# Patient Record
Sex: Male | Born: 1975 | Hispanic: Yes | Marital: Married | Smoking: Current every day smoker
Health system: Southern US, Community
[De-identification: ages and names within clinical notes are randomized; demographics above are authoritative.]

## PROBLEM LIST (undated history)

## (undated) DIAGNOSIS — E785 Hyperlipidemia, unspecified: Secondary | ICD-10-CM

## (undated) DIAGNOSIS — K429 Umbilical hernia without obstruction or gangrene: Secondary | ICD-10-CM

## (undated) HISTORY — DX: Umbilical hernia without obstruction or gangrene: K42.9

## (undated) HISTORY — DX: Hyperlipidemia, unspecified: E78.5

---

## 2010-12-28 ENCOUNTER — Other Ambulatory Visit: Payer: Self-pay | Admitting: Occupational Medicine

## 2010-12-28 ENCOUNTER — Ambulatory Visit: Payer: Self-pay

## 2010-12-28 DIAGNOSIS — M549 Dorsalgia, unspecified: Secondary | ICD-10-CM

## 2014-04-26 ENCOUNTER — Emergency Department (HOSPITAL_COMMUNITY)
Admission: EM | Admit: 2014-04-26 | Discharge: 2014-04-26 | Disposition: A | Discharge status: Home / Self Care | Payer: Self-pay | Attending: Emergency Medicine | Admitting: Emergency Medicine

## 2014-04-26 ENCOUNTER — Emergency Department (HOSPITAL_COMMUNITY): Payer: Self-pay

## 2014-04-26 ENCOUNTER — Encounter (HOSPITAL_COMMUNITY): Payer: Self-pay | Admitting: *Deleted

## 2014-04-26 DIAGNOSIS — J45901 Unspecified asthma with (acute) exacerbation: Secondary | ICD-10-CM | POA: Insufficient documentation

## 2014-04-26 DIAGNOSIS — Z720 Tobacco use: Secondary | ICD-10-CM | POA: Insufficient documentation

## 2014-04-26 LAB — CBC WITH DIFFERENTIAL/PLATELET
Basophils Absolute: 0.1 K/uL (ref 0.0–0.1)
Basophils Relative: 1 % (ref 0–1)
Eosinophils Absolute: 1.3 K/uL — ABNORMAL HIGH (ref 0.0–0.7)
Eosinophils Relative: 12 % — ABNORMAL HIGH (ref 0–5)
HCT: 43.3 % (ref 39.0–52.0)
Hemoglobin: 14.7 g/dL (ref 13.0–17.0)
Lymphocytes Relative: 21 % (ref 12–46)
Lymphs Abs: 2.2 K/uL (ref 0.7–4.0)
MCH: 32 pg (ref 26.0–34.0)
MCHC: 33.9 g/dL (ref 30.0–36.0)
MCV: 94.1 fL (ref 78.0–100.0)
Monocytes Absolute: 0.8 K/uL (ref 0.1–1.0)
Monocytes Relative: 7 % (ref 3–12)
Neutro Abs: 6.3 K/uL (ref 1.7–7.7)
Neutrophils Relative %: 59 % (ref 43–77)
Platelets: 230 K/uL (ref 150–400)
RBC: 4.6 MIL/uL (ref 4.22–5.81)
RDW: 12.3 % (ref 11.5–15.5)
WBC: 10.7 K/uL — ABNORMAL HIGH (ref 4.0–10.5)

## 2014-04-26 LAB — BASIC METABOLIC PANEL WITH GFR
Anion gap: 9 (ref 5–15)
BUN: 12 mg/dL (ref 6–23)
CO2: 22 mmol/L (ref 19–32)
Calcium: 9.2 mg/dL (ref 8.4–10.5)
Chloride: 107 mmol/L (ref 96–112)
Creatinine, Ser: 0.63 mg/dL (ref 0.50–1.35)
GFR calc Af Amer: >90 mL/min (ref >90)
GFR calc non Af Amer: >90 mL/min (ref >90)
Glucose, Bld: 121 mg/dL — ABNORMAL HIGH (ref 70–99)
Potassium: 4 mmol/L (ref 3.5–5.1)
Sodium: 138 mmol/L (ref 135–145)

## 2014-04-26 LAB — I-STAT TROPONIN, ED: Troponin i, poc: 0 ng/mL (ref 0.00–0.08)

## 2014-04-26 MED ORDER — IPRATROPIUM-ALBUTEROL 0.5-2.5 (3) MG/3ML IN SOLN
3.0000 mL | Freq: Once | RESPIRATORY_TRACT | Status: AC
  Administered 2014-04-26: 3 mL via RESPIRATORY_TRACT
  Filled 2014-04-26: qty 3

## 2014-04-26 MED ORDER — PREDNISONE 20 MG PO TABS
40.0000 mg | ORAL_TABLET | Freq: Once | ORAL | Status: AC
  Administered 2014-04-26: 40 mg via ORAL
  Filled 2014-04-26: qty 2

## 2014-04-26 MED ORDER — ALBUTEROL SULFATE HFA 108 (90 BASE) MCG/ACT IN AERS
2.0000 | INHALATION_SPRAY | Freq: Once | RESPIRATORY_TRACT | Status: AC
  Administered 2014-04-26: 2 via RESPIRATORY_TRACT
  Filled 2014-04-26: qty 6.7

## 2014-04-26 MED ORDER — METHYLPREDNISOLONE SODIUM SUCC 125 MG IJ SOLR
125.0000 mg | Freq: Once | INTRAMUSCULAR | Status: DC
  Filled 2014-04-26: qty 2

## 2014-04-26 MED ORDER — PREDNISONE 10 MG PO TABS: 20.0000 mg | ORAL_TABLET | Freq: Every day | ORAL | Status: DC

## 2014-04-26 MED ORDER — AZITHROMYCIN 250 MG PO TABS: 250.0000 mg | ORAL_TABLET | Freq: Every day | ORAL | Status: DC

## 2014-08-01 ENCOUNTER — Emergency Department (INDEPENDENT_AMBULATORY_CARE_PROVIDER_SITE_OTHER)
Admission: EM | Admit: 2014-08-01 | Discharge: 2014-08-01 | Disposition: A | Discharge status: Home / Self Care | Payer: Self-pay | Source: Home / Self Care | Attending: Emergency Medicine | Admitting: Emergency Medicine

## 2014-08-01 ENCOUNTER — Encounter (HOSPITAL_COMMUNITY): Payer: Self-pay | Admitting: Emergency Medicine

## 2014-08-01 DIAGNOSIS — R0789 Other chest pain: Secondary | ICD-10-CM

## 2014-08-01 MED ORDER — DICLOFENAC POTASSIUM 50 MG PO TABS: 50.0000 mg | ORAL_TABLET | Freq: Three times a day (TID) | ORAL | Status: DC

## 2014-08-01 NOTE — ED Provider Notes (Signed)
CSN: [id number]     Arrival date & time 08/01/14  1807 History   First MD Initiated Contact with Patient 08/01/14 1842     Chief Complaint  Patient presents with  . Chest Pain  . Hernia   (Consider location/radiation/quality/duration/timing/severity/associated sxs/prior Treatment) HPI Comments: 39 year old Hispanic male complaining of mid chest pain for 3-4 days. He states he was help changing a tire when he was straining to return the lug nuts and experienced sudden acute pain at the manubrium. The pain is worse with lifting and performing isotonic movements with his upper arms. The pain is nonradiating. It is located directly over the manubrium. There is no tenderness to the sternum or ribs. No shortness of breath. No heaviness tightness fullness or pressure.  Is also concerned about a very small bulge within the umbilicus. It is nontender. It does not protrude from the umbilicus but remains within the umbilicus. It is quite small.  Patient is a 39 y.o. male presenting with chest pain.  Chest Pain Associated symptoms: no cough, no dizziness, no headache, no numbness, no shortness of breath and no weakness     History reviewed. No pertinent past medical history. History reviewed. No pertinent past surgical history. No family history on file. History  Substance Use Topics  . Smoking status: Current Every Day Smoker  . Smokeless tobacco: Not on file  . Alcohol Use: Yes    Review of Systems  Constitutional: Negative.   Respiratory: Negative.  Negative for cough, chest tightness and shortness of breath.   Cardiovascular: Positive for chest pain.  Gastrointestinal: Negative.   Genitourinary: Negative.   Musculoskeletal:       As per HPI  Skin: Negative.   Neurological: Negative for dizziness, weakness, numbness and headaches.    Allergies  Review of patient's allergies indicates no known allergies.  Home Medications   Prior to Admission medications   Medication Sig Start  Date End Date Taking? Authorizing Provider  azithromycin (ZITHROMAX Z-PAK) 250 MG tablet Take 1 tablet (250 mg total) by mouth daily. Take two tablets by mouth on day one and one tablet by mouth daily on days two through five 04/26/14   Tilden Fossa, MD  diclofenac (CATAFLAM) 50 MG tablet Take 1 tablet (50 mg total) by mouth 3 (three) times daily. One tablet TID with food prn pain. 08/01/14   Hayden Rasmussen, NP  predniSONE (DELTASONE) 10 MG tablet Take 2 tablets (20 mg total) by mouth daily. 04/26/14   Tilden Fossa, MD   BP 126/81 mmHg  Pulse 71  Temp(Src) 97.7 F (36.5 C) (Oral)  Resp 18  SpO2 95% Physical Exam  Constitutional: He is oriented to person, place, and time. He appears well-developed and well-nourished. No distress.  HENT:  Head: Normocephalic and atraumatic.  Eyes: EOM are normal. Left eye exhibits no discharge.  Neck: Normal range of motion. Neck supple.  Cardiovascular: Normal rate, regular rhythm and normal heart sounds.   Pulmonary/Chest: Effort normal and breath sounds normal. No respiratory distress. He has no wheezes. He exhibits tenderness.  Severe tenderness to the sternal manubrium. No tenderness to the sternum or surrounding ribs. Pain is reproduced when the patient takes a deep breath, moves his arms or performs isotonic contractions of the chest muscles.  Neurological: He is alert and oriented to person, place, and time. No cranial nerve deficit.  Skin: Skin is warm and dry.  Psychiatric: He has a normal mood and affect.  Nursing note and vitals reviewed.   ED Course  Procedures (including critical care time) Labs Review Labs Reviewed - No data to display  Imaging Review No results found.   MDM   1. Chest wall pain    Suspect strain of junction between manubrium and ribs and overlying soft tissues. Ice cataflam as dir Limit lifting, pulling, etc.    Hayden Rasmussen, NP 08/01/14 1914

## 2014-08-13 ENCOUNTER — Emergency Department (INDEPENDENT_AMBULATORY_CARE_PROVIDER_SITE_OTHER)
Admission: EM | Admit: 2014-08-13 | Discharge: 2014-08-13 | Disposition: A | Discharge status: Home / Self Care | Payer: Self-pay | Source: Home / Self Care | Attending: Emergency Medicine | Admitting: Emergency Medicine

## 2014-08-13 ENCOUNTER — Encounter (HOSPITAL_COMMUNITY): Payer: Self-pay | Admitting: Emergency Medicine

## 2014-08-13 DIAGNOSIS — M549 Dorsalgia, unspecified: Secondary | ICD-10-CM

## 2014-08-13 DIAGNOSIS — K602 Anal fissure, unspecified: Secondary | ICD-10-CM

## 2014-08-13 MED ORDER — NAPROXEN 500 MG PO TABS: 500.0000 mg | ORAL_TABLET | Freq: Two times a day (BID) | ORAL | Status: DC | PRN

## 2014-08-13 MED ORDER — SENNOSIDES-DOCUSATE SODIUM 8.6-50 MG PO TABS: 1.0000 | ORAL_TABLET | Freq: Every day | ORAL | Status: DC

## 2014-08-13 MED ORDER — CYCLOBENZAPRINE HCL 10 MG PO TABS: 10.0000 mg | ORAL_TABLET | Freq: Every day | ORAL | Status: DC

## 2014-08-13 MED ORDER — HYDROCORTISONE 2.5 % RE CREA: TOPICAL_CREAM | RECTAL | Status: DC

## 2015-04-17 ENCOUNTER — Ambulatory Visit (INDEPENDENT_AMBULATORY_CARE_PROVIDER_SITE_OTHER): Payer: Self-pay

## 2015-04-17 ENCOUNTER — Encounter (HOSPITAL_COMMUNITY): Payer: Self-pay | Admitting: *Deleted

## 2015-04-17 ENCOUNTER — Ambulatory Visit (HOSPITAL_COMMUNITY)
Admission: EM | Admit: 2015-04-17 | Discharge: 2015-04-17 | Disposition: A | Discharge status: Home / Self Care | Payer: Self-pay | Attending: Family Medicine | Admitting: Family Medicine

## 2015-04-17 DIAGNOSIS — R6889 Other general symptoms and signs: Secondary | ICD-10-CM

## 2015-04-17 MED ORDER — ACETAMINOPHEN 325 MG PO TABS
650.0000 mg | ORAL_TABLET | Freq: Once | ORAL | Status: AC
  Administered 2015-04-17: 650 mg via ORAL

## 2015-04-17 MED ORDER — IBUPROFEN 800 MG PO TABS
ORAL_TABLET | ORAL | Status: AC
  Filled 2015-04-17: qty 1

## 2015-04-17 MED ORDER — IBUPROFEN 800 MG PO TABS
800.0000 mg | ORAL_TABLET | Freq: Once | ORAL | Status: AC
  Administered 2015-04-17: 800 mg via ORAL

## 2015-04-17 MED ORDER — ACETAMINOPHEN 325 MG PO TABS
ORAL_TABLET | ORAL | Status: AC
  Filled 2015-04-17: qty 2

## 2015-04-27 ENCOUNTER — Ambulatory Visit (HOSPITAL_COMMUNITY)
Admission: EM | Admit: 2015-04-27 | Discharge: 2015-04-27 | Disposition: A | Discharge status: Home / Self Care | Payer: Self-pay | Attending: Family Medicine | Admitting: Family Medicine

## 2015-04-27 ENCOUNTER — Encounter (HOSPITAL_COMMUNITY): Payer: Self-pay | Admitting: Nurse Practitioner

## 2015-04-27 ENCOUNTER — Ambulatory Visit (INDEPENDENT_AMBULATORY_CARE_PROVIDER_SITE_OTHER): Payer: Self-pay

## 2015-04-27 DIAGNOSIS — J189 Pneumonia, unspecified organism: Secondary | ICD-10-CM

## 2015-04-27 MED ORDER — LEVOFLOXACIN 750 MG PO TABS: 750.0000 mg | ORAL_TABLET | Freq: Every day | ORAL | Status: DC

## 2015-04-27 MED ORDER — IPRATROPIUM-ALBUTEROL 0.5-2.5 (3) MG/3ML IN SOLN
RESPIRATORY_TRACT | Status: AC
  Filled 2015-04-27: qty 3

## 2015-04-27 MED ORDER — IPRATROPIUM-ALBUTEROL 0.5-2.5 (3) MG/3ML IN SOLN
3.0000 mL | Freq: Once | RESPIRATORY_TRACT | Status: AC
  Administered 2015-04-27: 3 mL via RESPIRATORY_TRACT

## 2015-04-27 MED ORDER — ALBUTEROL SULFATE HFA 108 (90 BASE) MCG/ACT IN AERS: 2.0000 | INHALATION_SPRAY | Freq: Four times a day (QID) | RESPIRATORY_TRACT | Status: DC | PRN

## 2015-04-27 NOTE — ED Provider Notes (Signed)
CSN: [id number]     Arrival date & time 04/27/15  1618 History   First MD Initiated Contact with Patient 04/27/15 1629     Chief Complaint  Patient presents with  . Cough   (Consider location/radiation/quality/duration/timing/severity/associated sxs/prior Treatment) HPI Seen and diagnosed with flu a few weeks ago. Continued cough, wheeze now. SOB with working, non smoker.  History reviewed. No pertinent past medical history. History reviewed. No pertinent past surgical history. History reviewed. No pertinent family history. Social History  Substance Use Topics  . Smoking status: Current Every Day Smoker  . Smokeless tobacco: None  . Alcohol Use: Yes    Review of Systems cough Allergies  Review of patient's allergies indicates no known allergies.  Home Medications   Prior to Admission medications   Medication Sig Start Date End Date Taking? Authorizing Provider  albuterol (PROVENTIL HFA;VENTOLIN HFA) 108 (90 Base) MCG/ACT inhaler Inhale 2 puffs into the lungs every 6 (six) hours as needed for wheezing or shortness of breath. 04/27/15   Tharon Aquas, PA  cyclobenzaprine (FLEXERIL) 10 MG tablet Take 1 tablet (10 mg total) by mouth at bedtime. 08/13/14   Charm Rings, MD  diclofenac (CATAFLAM) 50 MG tablet Take 1 tablet (50 mg total) by mouth 3 (three) times daily. One tablet TID with food prn pain. 08/01/14   Hayden Rasmussen, NP  hydrocortisone (ANUSOL-HC) 2.5 % rectal cream Apply rectally 2 times daily for 1 week 08/13/14   Charm Rings, MD  levofloxacin (LEVAQUIN) 750 MG tablet Take 1 tablet (750 mg total) by mouth daily. 04/27/15   Tharon Aquas, PA  naproxen (NAPROSYN) 500 MG tablet Take 1 tablet (500 mg total) by mouth 2 (two) times daily as needed for moderate pain. 08/13/14   Charm Rings, MD  predniSONE (DELTASONE) 10 MG tablet Take 2 tablets (20 mg total) by mouth daily. 04/26/14   Tilden Fossa, MD  senna-docusate (SENOKOT-S) 8.6-50 MG per tablet Take 1 tablet by mouth at bedtime.  08/13/14   Charm Rings, MD   Meds Ordered and Administered this Visit   Medications  ipratropium-albuterol (DUONEB) 0.5-2.5 (3) MG/3ML nebulizer solution 3 mL (3 mLs Nebulization Given 04/27/15 1713)    BP 102/65 mmHg  Pulse 92  Temp(Src) 98.4 F (36.9 C) (Oral)  Resp 20  SpO2 94% No data found.   Physical Exam NURSES NOTES AND VITAL SIGNS REVIEWED. CONSTITUTIONAL: Well developed, well nourished, no acute distress HEENT: normocephalic, atraumatic EYES: Conjunctiva normal NECK:normal ROM, supple, no adenopathy PULMONARY:No respiratory distress, normal effort, wheeze and rales with consolidation LLL.  MUSCULOSKELETAL: Normal ROM of all extremities,  SKIN: warm and dry without rash PSYCHIATRIC: Mood and affect, behavior are normal  ED Course  Procedures (including critical care time)  Labs Review Labs Reviewed - No data to display  Imaging Review Dg Chest 2 View  04/27/2015  CLINICAL DATA:  Cough EXAM: CHEST  2 VIEW COMPARISON:  04/17/2015 chest radiograph. FINDINGS: Stable cardiomediastinal silhouette with normal heart size. No pneumothorax. No pleural effusion. New patchy consolidation in the left lower lobe. No pulmonary edema. IMPRESSION: New patchy left lower lobe consolidation, most suggestive of a left lower lobe pneumonia. Recommend follow-up PA and lateral post treatment chest radiographs in 4-6 weeks. Electronically Signed   By: Delbert Phenix M.D.   On: 04/27/2015 17:15     Visual Acuity Review  Right Eye Distance:   Left Eye Distance:   Bilateral Distance:    Right Eye Near:   Left  Eye Near:    Bilateral Near:      rx levaquin 750 mg/albuterol   MDM   1. Community acquired pneumonia     Patient is reassured that there are no issues that require transfer to higher level of care at this time or additional tests. Patient is advised to continue home symptomatic treatment. Patient is advised that if there are new or worsening symptoms to attend the  emergency department, contact primary care provider, or return to UC. Instructions of care provided discharged home in stable condition.    THIS NOTE WAS GENERATED USING A VOICE RECOGNITION SOFTWARE PROGRAM. ALL REASONABLE EFFORTS  WERE MADE TO PROOFREAD THIS DOCUMENT FOR ACCURACY.  I have verbally reviewed the discharge instructions with the patient. A printed AVS was given to the patient.  All questions were answered prior to discharge.      Tharon AquasFrank C Makailee Nudelman, PA 04/27/15 1756

## 2015-11-17 ENCOUNTER — Ambulatory Visit: Payer: Self-pay

## 2015-11-17 ENCOUNTER — Other Ambulatory Visit: Payer: Self-pay | Admitting: Occupational Medicine

## 2015-11-17 DIAGNOSIS — M25531 Pain in right wrist: Secondary | ICD-10-CM

## 2015-12-02 ENCOUNTER — Encounter (INDEPENDENT_AMBULATORY_CARE_PROVIDER_SITE_OTHER): Payer: Self-pay | Admitting: Orthopaedic Surgery

## 2015-12-02 ENCOUNTER — Ambulatory Visit (INDEPENDENT_AMBULATORY_CARE_PROVIDER_SITE_OTHER): Payer: Worker's Compensation | Admitting: Orthopaedic Surgery

## 2015-12-02 DIAGNOSIS — M25531 Pain in right wrist: Secondary | ICD-10-CM

## 2015-12-16 ENCOUNTER — Encounter (INDEPENDENT_AMBULATORY_CARE_PROVIDER_SITE_OTHER): Payer: Self-pay | Admitting: Orthopaedic Surgery

## 2015-12-16 ENCOUNTER — Ambulatory Visit (INDEPENDENT_AMBULATORY_CARE_PROVIDER_SITE_OTHER): Payer: Worker's Compensation | Admitting: Orthopaedic Surgery

## 2015-12-16 ENCOUNTER — Other Ambulatory Visit (INDEPENDENT_AMBULATORY_CARE_PROVIDER_SITE_OTHER): Payer: Self-pay

## 2015-12-16 DIAGNOSIS — M25531 Pain in right wrist: Secondary | ICD-10-CM

## 2016-01-08 ENCOUNTER — Telehealth (INDEPENDENT_AMBULATORY_CARE_PROVIDER_SITE_OTHER): Payer: Self-pay | Admitting: Orthopaedic Surgery

## 2016-01-09 ENCOUNTER — Ambulatory Visit (INDEPENDENT_AMBULATORY_CARE_PROVIDER_SITE_OTHER): Payer: Worker's Compensation | Admitting: Orthopaedic Surgery

## 2016-01-09 ENCOUNTER — Encounter (INDEPENDENT_AMBULATORY_CARE_PROVIDER_SITE_OTHER): Payer: Self-pay

## 2016-01-09 ENCOUNTER — Encounter (INDEPENDENT_AMBULATORY_CARE_PROVIDER_SITE_OTHER): Payer: Self-pay | Admitting: Orthopaedic Surgery

## 2016-01-09 DIAGNOSIS — M25531 Pain in right wrist: Secondary | ICD-10-CM | POA: Diagnosis not present

## 2016-01-09 NOTE — Progress Notes (Signed)
   Office Visit Note   Patient: Anthony Gutierrez           Date of Birth: [birth year]           MRN: [id number] Visit Date: 01/09/2016              Requested by: No referring provider defined for this encounter. PCP: No PCP Per Patient   Assessment & Plan: Visit Diagnoses:  1. Pain in right wrist     Plan: MRI reviewed which shows partial tear of scapholunate ligament with ganglion cyst emanating from the joint. At this point I think that could be best if he was limited to less than 20 pounds of lifting. He may wean his wrist brace as tolerated. I do think that hand therapy would benefit him. I'll see him back in 6 weeks for recheck. Total face to face encounter time was greater than 25 minutes and over half of this time was spent in counseling and/or coordination of care.   Follow-Up Instructions: Return in about 6 weeks (around 02/20/2016).   Orders:  No orders of the defined types were placed in this encounter.  No orders of the defined types were placed in this encounter.     Procedures: No procedures performed   Clinical Data: No additional findings.   Subjective: Chief Complaint  Patient presents with  . Right Wrist - Follow-up    Review MRI    Patient comes back today to review his MRI of his right wrist. He states that he continues to have pain that is mild. He is essentially doing full duty. The pain does not radiate.    Review of Systems Negative except for history of present illness  Objective: Vital Signs: There were no vitals taken for this visit.  Physical Exam Well-developed well-nourished no acute distress alert and 3 Ortho Exam Exam of the right wrist shows tenderness to palpation of the scapholunate interval. I did not cruciate a palpable ganglion cyst. He has good range of motion. His grip strength is slightly weak. Specialty Comments:  No specialty comments available.  Imaging: No results found.   PMFS History: Patient Active Problem  List   Diagnosis Date Noted  . Pain in right wrist 01/09/2016   No past medical history on file.  No family history on file.  No past surgical history on file. Social History   Occupational History  . Not on file.   Social History Main Topics  . Smoking status: Former Games developermoker  . Smokeless tobacco: Never Used  . Alcohol use Not on file  . Drug use: Unknown  . Sexual activity: Not on file

## 2016-01-30 ENCOUNTER — Telehealth (INDEPENDENT_AMBULATORY_CARE_PROVIDER_SITE_OTHER): Payer: Self-pay | Admitting: Orthopaedic Surgery

## 2016-01-30 NOTE — Telephone Encounter (Signed)
Faxed note to [phone]

## 2016-02-03 ENCOUNTER — Telehealth (INDEPENDENT_AMBULATORY_CARE_PROVIDER_SITE_OTHER): Payer: Self-pay

## 2016-02-03 ENCOUNTER — Telehealth (INDEPENDENT_AMBULATORY_CARE_PROVIDER_SITE_OTHER): Payer: Self-pay | Admitting: Orthopaedic Surgery

## 2016-02-03 NOTE — Telephone Encounter (Signed)
If patient is post op please include surgical notes along with Rx for hand therapy.  Patients appointment is at 7am tomorrow morning.

## 2016-02-03 NOTE — Telephone Encounter (Signed)
Anthony Gutierrez is requesting Rx faxed for physical therapy. [phone]

## 2016-02-04 ENCOUNTER — Telehealth (INDEPENDENT_AMBULATORY_CARE_PROVIDER_SITE_OTHER): Payer: Self-pay | Admitting: Orthopaedic Surgery

## 2016-02-05 ENCOUNTER — Telehealth (INDEPENDENT_AMBULATORY_CARE_PROVIDER_SITE_OTHER): Payer: Self-pay

## 2016-02-12 ENCOUNTER — Telehealth (INDEPENDENT_AMBULATORY_CARE_PROVIDER_SITE_OTHER): Payer: Self-pay | Admitting: *Deleted

## 2016-02-12 NOTE — Telephone Encounter (Signed)
Anthony Gutierrez from [employer] called this afternoon in regards to needing office notes and also physical therapy orders as well. Her CB # [phone]. Thank you

## 2016-02-20 ENCOUNTER — Ambulatory Visit (INDEPENDENT_AMBULATORY_CARE_PROVIDER_SITE_OTHER): Payer: Self-pay | Admitting: Orthopaedic Surgery

## 2016-02-23 ENCOUNTER — Ambulatory Visit (INDEPENDENT_AMBULATORY_CARE_PROVIDER_SITE_OTHER): Payer: Worker's Compensation | Admitting: Orthopaedic Surgery

## 2016-02-23 ENCOUNTER — Encounter (INDEPENDENT_AMBULATORY_CARE_PROVIDER_SITE_OTHER): Payer: Self-pay | Admitting: Orthopaedic Surgery

## 2016-02-23 DIAGNOSIS — M25531 Pain in right wrist: Secondary | ICD-10-CM

## 2016-02-23 NOTE — Progress Notes (Signed)
   Office Visit Note   Patient: Anthony Gutierrez           Date of Birth: 03/29/1975           MRN: [id number] Visit Date: 02/23/2016              Requested by: No referring provider defined for this encounter. PCP: No PCP Per Patient   Assessment & Plan: Visit Diagnoses: No diagnosis found.  Plan: d/c wrist brace.  D/c OT and begin work conditioning x 6 weeks.  F/u in 6 weeks for recheck.  Follow-Up Instructions: Return in about 6 weeks (around 04/05/2016).   Orders:  No orders of the defined types were placed in this encounter.  No orders of the defined types were placed in this encounter.     Procedures: No procedures performed   Clinical Data: No additional findings.   Subjective: Chief Complaint  Patient presents with  . Left Wrist - Pain, Follow-up    41 yo male here for follow up for right wrist pain and partial SL tear.  Doing better.  Doing OT twice a week.      Review of Systems  Constitutional: Negative.   All other systems reviewed and are negative.    Objective: Vital Signs: There were no vitals taken for this visit.  Physical Exam  Constitutional: He is oriented to person, place, and time. He appears well-developed and well-nourished.  Pulmonary/Chest: Effort normal.  Abdominal: Soft.  Neurological: He is alert and oriented to person, place, and time.  Skin: Skin is warm.  Psychiatric: He has a normal mood and affect. His behavior is normal. Judgment and thought content normal.  Nursing note and vitals reviewed.   Ortho Exam TTP SL interval.  No obvious ganglion cyst Specialty Comments:  No specialty comments available.  Imaging: No results found.   PMFS History: Patient Active Problem List   Diagnosis Date Noted  . Pain in right wrist 01/09/2016   No past medical history on file.  No family history on file.  No past surgical history on file. Social History   Occupational History  . Not on file.   Social History Main Topics   . Smoking status: Former Games developermoker  . Smokeless tobacco: Never Used  . Alcohol use Not on file  . Drug use: Unknown  . Sexual activity: Not on file

## 2016-02-27 ENCOUNTER — Telehealth (INDEPENDENT_AMBULATORY_CARE_PROVIDER_SITE_OTHER): Payer: Self-pay | Admitting: Orthopaedic Surgery

## 2016-04-05 ENCOUNTER — Encounter (INDEPENDENT_AMBULATORY_CARE_PROVIDER_SITE_OTHER): Payer: Self-pay | Admitting: Orthopaedic Surgery

## 2016-04-05 ENCOUNTER — Encounter (HOSPITAL_COMMUNITY): Payer: Self-pay | Admitting: Emergency Medicine

## 2016-04-05 ENCOUNTER — Ambulatory Visit (HOSPITAL_COMMUNITY)
Admission: EM | Admit: 2016-04-05 | Discharge: 2016-04-05 | Disposition: A | Discharge status: Home / Self Care | Payer: Self-pay | Attending: Internal Medicine | Admitting: Internal Medicine

## 2016-04-05 ENCOUNTER — Ambulatory Visit (INDEPENDENT_AMBULATORY_CARE_PROVIDER_SITE_OTHER): Payer: Worker's Compensation | Admitting: Orthopaedic Surgery

## 2016-04-05 DIAGNOSIS — B9789 Other viral agents as the cause of diseases classified elsewhere: Secondary | ICD-10-CM

## 2016-04-05 DIAGNOSIS — M25531 Pain in right wrist: Secondary | ICD-10-CM

## 2016-04-05 DIAGNOSIS — J069 Acute upper respiratory infection, unspecified: Secondary | ICD-10-CM

## 2016-04-05 MED ORDER — BENZONATATE 100 MG PO CAPS: 100.0000 mg | ORAL_CAPSULE | Freq: Three times a day (TID) | ORAL | 0 refills | Status: DC

## 2016-04-05 MED ORDER — ONDANSETRON 4 MG PO TBDP: 4.0000 mg | ORAL_TABLET | Freq: Three times a day (TID) | ORAL | 0 refills | Status: DC | PRN

## 2016-04-05 MED ORDER — DICLOFENAC SODIUM 75 MG PO TBEC: 75.0000 mg | DELAYED_RELEASE_TABLET | Freq: Two times a day (BID) | ORAL | 0 refills | Status: DC

## 2016-04-05 NOTE — ED Provider Notes (Signed)
CSN: [id number]     Arrival date & time 04/05/16  1734 History   First MD Initiated Contact with Patient 04/05/16 1916     Chief Complaint  Patient presents with  . URI   (Consider location/radiation/quality/duration/timing/severity/associated sxs/prior Treatment) 41 year old male presents with 48 hours history of URI type symptoms    The history is provided by the patient.  URI  Presenting symptoms: congestion, cough, fatigue, fever and rhinorrhea   Presenting symptoms: no sore throat   Congestion:    Location:  Nasal   Interferes with sleep: no     Interferes with eating/drinking: no   Fatigue:    Severity:  Moderate   Duration:  2 days   Timing:  Constant   Progression:  Unchanged Severity:  Moderate Onset quality:  Gradual Duration:  2 days Timing:  Constant Progression:  Worsening Chronicity:  New Relieved by:  Nothing Worsened by:  Nothing Ineffective treatments:  None tried Associated symptoms: headaches, myalgias, sinus pain and sneezing   Associated symptoms: no neck pain, no swollen glands and no wheezing     History reviewed. No pertinent past medical history. History reviewed. No pertinent surgical history. History reviewed. No pertinent family history. Social History  Substance Use Topics  . Smoking status: Current Every Day Smoker    Packs/day: 0.10    Types: Cigarettes  . Smokeless tobacco: Never Used  . Alcohol use Yes     Comment: occasional    Review of Systems  Constitutional: Positive for fatigue and fever.  HENT: Positive for congestion, rhinorrhea, sinus pain and sneezing. Negative for sore throat.   Respiratory: Positive for cough. Negative for wheezing.   Gastrointestinal: Positive for diarrhea and nausea. Negative for abdominal pain and vomiting.  Musculoskeletal: Positive for myalgias. Negative for neck pain.  Neurological: Positive for headaches. Negative for dizziness and weakness.  All other systems reviewed and are  negative.   Allergies  Patient has no known allergies.  Home Medications   Prior to Admission medications   Medication Sig Start Date End Date Taking? Authorizing Provider  benzonatate (TESSALON) 100 MG capsule Take 1 capsule (100 mg total) by mouth every 8 (eight) hours. 04/05/16   Dorena Bodo, NP  diclofenac (VOLTAREN) 75 MG EC tablet Take 1 tablet (75 mg total) by mouth 2 (two) times daily. 04/05/16   Dorena Bodo, NP  ibuprofen (ADVIL,MOTRIN) 800 MG tablet Take 800 mg by mouth every 8 (eight) hours as needed.    Historical Provider, MD  ondansetron (ZOFRAN ODT) 4 MG disintegrating tablet Take 1 tablet (4 mg total) by mouth every 8 (eight) hours as needed for nausea or vomiting. 04/05/16   Dorena Bodo, NP   Meds Ordered and Administered this Visit  Medications - No data to display  BP 123/69 (BP Location: Left Arm)   Pulse 77   Temp 99.7 F (37.6 C) (Oral)   SpO2 95%  No data found.   Physical Exam  Constitutional: He is oriented to person, place, and time. He appears well-developed and well-nourished. No distress.  HENT:  Head: Normocephalic and atraumatic.  Right Ear: Tympanic membrane and external ear normal.  Left Ear: Tympanic membrane and external ear normal.  Nose: Rhinorrhea present. Right sinus exhibits no maxillary sinus tenderness and no frontal sinus tenderness. Left sinus exhibits no maxillary sinus tenderness and no frontal sinus tenderness.  Mouth/Throat: Uvula is midline and oropharynx is clear and moist. No oropharyngeal exudate.  Eyes: Pupils are equal, round, and reactive to  light.  Neck: Normal range of motion. Neck supple. No JVD present.  Cardiovascular: Normal rate and regular rhythm.   Pulmonary/Chest: Effort normal and breath sounds normal. No respiratory distress. He has no wheezes.  Abdominal: Soft. Bowel sounds are normal.  Lymphadenopathy:    He has no cervical adenopathy.  Neurological: He is alert and oriented to person, place, and  time.  Skin: Skin is warm and dry. Capillary refill takes less than 2 seconds. No rash noted. He is not diaphoretic. No erythema.  Psychiatric: He has a normal mood and affect. His behavior is normal.  Nursing note and vitals reviewed.   Urgent Care Course     Procedures (including critical care time)  Labs Review Labs Reviewed - No data to display  Imaging Review No results found.    MDM   1. Viral upper respiratory tract infection     Treating for viral URI. Provided counseling on over-the-counter therapies for symptom management. Provide Tessalon for cough, Zofran for nausea, diclofenac for pain. Recommend rest, plenty of fluids, follow up with a primary care provider if symptoms persist, return to clinic as needed.    Dorena BodoLawrence Jeorgia Helming, NP 04/05/16 1939

## 2016-04-05 NOTE — Progress Notes (Addendum)
   Office Visit Note   Patient: Anthony Gutierrez           Date of Birth: 03/29/1975           MRN: [id number] Visit Date: 04/05/2016              Requested by: No referring provider defined for this encounter. PCP: No PCP Per Patient   Assessment & Plan: Visit Diagnoses:  1. Pain in right wrist     Plan: Wrist injection was performed today I'll like to reevaluate in 4 weeks. If not better we'll likely refer to hand surgeon.  Continue light duty for 4 weeks.    Follow-Up Instructions: Return in about 4 weeks (around 05/03/2016).   Orders:  No orders of the defined types were placed in this encounter.  No orders of the defined types were placed in this encounter.     Procedures: Medium Joint Inj Date/Time: 04/15/2016 3:05 PM Performed by: Tarry KosXU, Laini Urick M Authorized by: Tarry KosXU, Gabrial Domine M   Consent Given by:  Patient Timeout: prior to procedure the correct patient, procedure, and site was verified   Indications:  Pain Location:  Wrist Site:  R radiocarpal Prep: patient was prepped and draped in usual sterile fashion   Needle Size:  25 G Approach:  Dorsal Ultrasound Guided: No   Fluoroscopic Guidance: No   Medications:  1 mL lidocaine 1 %; 1 mL bupivacaine 0.5 %; 40 mg methylPREDNISolone acetate 40 MG/ML     Clinical Data: No additional findings.   Subjective: Chief Complaint  Patient presents with  . Right Wrist - Follow-up    Patient follows up today status post 12 sessions of work conditioning. He is progressing but still continues to have pain and weakness.    Review of Systems   Objective: Vital Signs: There were no vitals taken for this visit.  Physical Exam  Ortho Exam Right wrist exam shows a small ganglion cyst that is somewhat difficult to palpate. He does have continued pain in the scapholunate interval Specialty Comments:  No specialty comments available.  Imaging: No results found.   PMFS History: Patient Active Problem List   Diagnosis  Date Noted  . Pain in right wrist 01/09/2016   No past medical history on file.  No family history on file.  No past surgical history on file. Social History   Occupational History  . Not on file.   Social History Main Topics  . Smoking status: Former Games developermoker  . Smokeless tobacco: Never Used  . Alcohol use Not on file  . Drug use: Unknown  . Sexual activity: Not on file

## 2016-04-15 DIAGNOSIS — M25531 Pain in right wrist: Secondary | ICD-10-CM | POA: Diagnosis not present

## 2016-04-15 MED ORDER — METHYLPREDNISOLONE ACETATE 40 MG/ML IJ SUSP
40.0000 mg | INTRAMUSCULAR | Status: AC | PRN
  Administered 2016-04-15: 40 mg via INTRA_ARTICULAR

## 2016-04-15 MED ORDER — BUPIVACAINE HCL 0.5 % IJ SOLN
1.0000 mL | INTRAMUSCULAR | Status: AC | PRN
  Administered 2016-04-15: 1 mL via INTRA_ARTICULAR

## 2016-04-15 MED ORDER — LIDOCAINE HCL 1 % IJ SOLN
1.0000 mL | INTRAMUSCULAR | Status: AC | PRN
  Administered 2016-04-15: 1 mL

## 2016-05-03 ENCOUNTER — Encounter (INDEPENDENT_AMBULATORY_CARE_PROVIDER_SITE_OTHER): Payer: Self-pay | Admitting: Orthopaedic Surgery

## 2016-05-03 ENCOUNTER — Ambulatory Visit (INDEPENDENT_AMBULATORY_CARE_PROVIDER_SITE_OTHER): Payer: Worker's Compensation | Admitting: Orthopaedic Surgery

## 2016-05-03 DIAGNOSIS — M25531 Pain in right wrist: Secondary | ICD-10-CM | POA: Diagnosis not present

## 2016-05-03 NOTE — Progress Notes (Signed)
   Office Visit Note   Patient: Anthony Gutierrez           Date of Birth: [birth year]           MRN: [id number] Visit Date: 05/03/2016              Requested by: No referring provider defined for this encounter. PCP: No PCP Per Patient   Assessment & Plan: Visit Diagnoses:  1. Pain in right wrist     Plan: At this point I will like to refer him to Dr. Janee Morn for further evaluation and treatment. He has failed conservative treatment. I'm not exactly sure why he continues to have pain. I'm not convinced that the ganglion cyst is really what he is feeling.  Follow-Up Instructions: Return if symptoms worsen or fail to improve.   Orders:  Orders Placed This Encounter  Procedures  . Ambulatory referral to Orthopedic Surgery   No orders of the defined types were placed in this encounter.     Procedures: No procedures performed   Clinical Data: No additional findings.   Subjective: Chief Complaint  Patient presents with  . Right Wrist - Follow-up, Pain    Patient comes back today to review and follow-up for right wrist pain. His injection did help significantly. He continues to have some mild pain and a lump on the dorsum of his wrist.    Review of Systems   Objective: Vital Signs: There were no vitals taken for this visit.  Physical Exam  Ortho Exam Right wrist exam shows no skin changes or swelling. His range of motion is improved. He does have a palpable subcutaneous mass. Specialty Comments:  No specialty comments available.  Imaging: No results found.   PMFS History: Patient Active Problem List   Diagnosis Date Noted  . Pain in right wrist 01/09/2016   No past medical history on file.  No family history on file.  No past surgical history on file. Social History   Occupational History  . Not on file.   Social History Main Topics  . Smoking status: Current Every Day Smoker    Packs/day: 0.10    Types: Cigarettes  . Smokeless tobacco: Never Used   . Alcohol use Yes     Comment: occasional  . Drug use: No  . Sexual activity: Not on file

## 2017-02-14 ENCOUNTER — Encounter (HOSPITAL_COMMUNITY): Payer: Self-pay | Admitting: Emergency Medicine

## 2017-02-14 ENCOUNTER — Other Ambulatory Visit: Payer: Self-pay

## 2017-02-14 ENCOUNTER — Emergency Department (HOSPITAL_COMMUNITY): Payer: Self-pay

## 2017-02-14 DIAGNOSIS — F17210 Nicotine dependence, cigarettes, uncomplicated: Secondary | ICD-10-CM | POA: Insufficient documentation

## 2017-02-14 DIAGNOSIS — J111 Influenza due to unidentified influenza virus with other respiratory manifestations: Secondary | ICD-10-CM | POA: Insufficient documentation

## 2017-02-14 DIAGNOSIS — R740 Nonspecific elevation of levels of transaminase and lactic acid dehydrogenase [LDH]: Secondary | ICD-10-CM | POA: Insufficient documentation

## 2017-02-14 DIAGNOSIS — Z79899 Other long term (current) drug therapy: Secondary | ICD-10-CM | POA: Insufficient documentation

## 2017-02-14 LAB — I-STAT CG4 LACTIC ACID, ED: Lactic Acid, Venous: 0.96 mmol/L (ref 0.5–1.9)

## 2017-02-14 MED ORDER — ACETAMINOPHEN 325 MG PO TABS
650.0000 mg | ORAL_TABLET | Freq: Once | ORAL | Status: AC | PRN
  Administered 2017-02-14: 650 mg via ORAL
  Filled 2017-02-14: qty 2

## 2017-02-15 ENCOUNTER — Emergency Department (HOSPITAL_COMMUNITY)
Admission: EM | Admit: 2017-02-15 | Discharge: 2017-02-15 | Disposition: A | Discharge status: Home / Self Care | Payer: Self-pay | Attending: Emergency Medicine | Admitting: Emergency Medicine

## 2017-02-15 DIAGNOSIS — R059 Cough, unspecified: Secondary | ICD-10-CM

## 2017-02-15 DIAGNOSIS — J111 Influenza due to unidentified influenza virus with other respiratory manifestations: Secondary | ICD-10-CM

## 2017-02-15 DIAGNOSIS — R7401 Elevation of levels of liver transaminase levels: Secondary | ICD-10-CM

## 2017-02-15 LAB — COMPREHENSIVE METABOLIC PANEL
ALT: 104 U/L — ABNORMAL HIGH (ref 17–63)
AST: 152 U/L — ABNORMAL HIGH (ref 15–41)
Albumin: 3.6 g/dL (ref 3.5–5.0)
Alkaline Phosphatase: 68 U/L (ref 38–126)
Anion gap: 12 (ref 5–15)
BUN: 13 mg/dL (ref 6–20)
CO2: 21 mmol/L — ABNORMAL LOW (ref 22–32)
Calcium: 8.4 mg/dL — ABNORMAL LOW (ref 8.9–10.3)
Chloride: 102 mmol/L (ref 101–111)
Creatinine, Ser: 0.76 mg/dL (ref 0.61–1.24)
GFR calc Af Amer: >60 mL/min (ref >60)
GFR calc non Af Amer: >60 mL/min (ref >60)
Glucose, Bld: 123 mg/dL — ABNORMAL HIGH (ref 65–99)
Potassium: 3.9 mmol/L (ref 3.5–5.1)
Sodium: 135 mmol/L (ref 135–145)
Total Bilirubin: 0.9 mg/dL (ref 0.3–1.2)
Total Protein: 6.2 g/dL — ABNORMAL LOW (ref 6.5–8.1)

## 2017-02-15 LAB — CBC WITH DIFFERENTIAL/PLATELET
Basophils Absolute: 0 10*3/uL (ref 0.0–0.1)
Basophils Relative: 0 %
Eosinophils Absolute: 0 10*3/uL (ref 0.0–0.7)
Eosinophils Relative: 1 %
HCT: 40.5 % (ref 39.0–52.0)
Hemoglobin: 13.5 g/dL (ref 13.0–17.0)
Lymphocytes Relative: 11 %
Lymphs Abs: 0.6 10*3/uL — ABNORMAL LOW (ref 0.7–4.0)
MCH: 32.1 pg (ref 26.0–34.0)
MCHC: 33.3 g/dL (ref 30.0–36.0)
MCV: 96.4 fL (ref 78.0–100.0)
Monocytes Absolute: 0.3 10*3/uL (ref 0.1–1.0)
Monocytes Relative: 5 %
Neutro Abs: 4.3 10*3/uL (ref 1.7–7.7)
Neutrophils Relative %: 83 %
Platelets: 193 10*3/uL (ref 150–400)
RBC: 4.2 MIL/uL — ABNORMAL LOW (ref 4.22–5.81)
RDW: 12.4 % (ref 11.5–15.5)
WBC: 5.2 10*3/uL (ref 4.0–10.5)

## 2017-02-15 LAB — I-STAT CG4 LACTIC ACID, ED: Lactic Acid, Venous: 1.11 mmol/L (ref 0.5–1.9)

## 2017-02-15 MED ORDER — OSELTAMIVIR PHOSPHATE 75 MG PO CAPS: 75.0000 mg | ORAL_CAPSULE | Freq: Two times a day (BID) | ORAL | 0 refills | Status: DC

## 2017-02-15 MED ORDER — SODIUM CHLORIDE 0.9 % IV BOLUS (SEPSIS): 1000.0000 mL | Freq: Once | INTRAVENOUS | Status: DC

## 2017-02-15 MED ORDER — IBUPROFEN 800 MG PO TABS
800.0000 mg | ORAL_TABLET | Freq: Once | ORAL | Status: AC
  Administered 2017-02-15: 800 mg via ORAL
  Filled 2017-02-15: qty 1

## 2017-02-15 NOTE — ED Provider Notes (Signed)
[facility] EMERGENCY DEPARTMENT Provider Note   CSN: [id number] Arrival date & time: 02/14/17  2245     History   Chief Complaint Chief Complaint  Patient presents with  . Fever  . Cough    HPI Anthony Gutierrez is a 42 y.o. male who presents today for evaluation of fever and cough.  He reports that for the past two days he has had dry, nonproductive cough and fever.  He reports that his head occasionally feels tight when he coughs a lot but denies headache when not coughing.  He denies any confusion, blurry vision or double vision.  He denies SOB.  He reports that his son has a similar illness.  He reports he has been trying ibuprofen '600mg'$  q8h with mild relief.  Reports he has used tylenol once.    He denies any previous history of liver problems.  Chart review shows he has never had a hepatic function panel/CMP drawn here before.  He denies any excessive tylenol use.  Reports he drinks occasionally.  He denies history of IV drug use.  No abdominal pain.  No history of trauma.  No chest pain or SOB.  He has smoked 25-30 years but has not been smoking since he got sick.  He denies productive cough, no increased sputum.    HPI  History reviewed. No pertinent past medical history.  Patient Active Problem List   Diagnosis Date Noted  . Pain in right wrist 01/09/2016    History reviewed. No pertinent surgical history.     Home Medications    Prior to Admission medications   Medication Sig Start Date End Date Taking? Authorizing Provider  benzonatate (TESSALON) 100 MG capsule Take 1 capsule (100 mg total) by mouth every 8 (eight) hours. 04/05/16   Barnet Glasgow, NP  diclofenac (VOLTAREN) 75 MG EC tablet Take 1 tablet (75 mg total) by mouth 2 (two) times daily. 04/05/16   Barnet Glasgow, NP  ibuprofen (ADVIL,MOTRIN) 800 MG tablet Take 800 mg by mouth every 8 (eight) hours as needed.    [provider]  ondansetron (ZOFRAN ODT) 4 MG disintegrating  tablet Take 1 tablet (4 mg total) by mouth every 8 (eight) hours as needed for nausea or vomiting. 04/05/16   Barnet Glasgow, NP    Family History No family history on file.  Social History Social History   Tobacco Use  . Smoking status: Current Every Day Smoker    Packs/day: 0.10    Types: Cigarettes  . Smokeless tobacco: Never Used  Substance Use Topics  . Alcohol use: Yes    Comment: occasional  . Drug use: No     Allergies   Patient has no known allergies.   Review of Systems Review of Systems  Constitutional: Positive for chills, fatigue and fever.  HENT: Negative for congestion, sinus pressure, sinus pain and trouble swallowing.   Eyes: Negative for visual disturbance.  Respiratory: Positive for cough. Negative for chest tightness and shortness of breath.   Cardiovascular: Negative for chest pain and palpitations.  Gastrointestinal: Negative for abdominal pain, constipation, diarrhea, nausea and vomiting.  Genitourinary: Negative for dysuria, flank pain and urgency.  Musculoskeletal: Positive for arthralgias and myalgias. Negative for neck pain and neck stiffness.  Skin: Negative for color change and rash.  Neurological: Positive for headaches (Only when coughing). Negative for dizziness and light-headedness.  Psychiatric/Behavioral: Negative for confusion.  All other systems reviewed and are negative.    Physical Exam Updated Vital Signs BP  107/62 (BP Location: Right Arm)   Pulse 85   Temp (!) 102.7 F (39.3 C) (Oral)   Resp 18   Ht '5\' 5"'$  (1.651 m)   Wt 83.9 kg (185 lb)   SpO2 95%   BMI 30.79 kg/m   Physical Exam  Constitutional: He is oriented to person, place, and time. He appears well-developed and well-nourished. No distress.  HENT:  Head: Normocephalic and atraumatic.  Mouth/Throat: Oropharynx is clear and moist.  Eyes: Conjunctivae and EOM are normal. Right eye exhibits no discharge. Left eye exhibits no discharge. No scleral icterus.  Neck:  Normal range of motion. Neck supple.  Cardiovascular: Normal rate, regular rhythm, normal heart sounds and intact distal pulses.  No murmur heard. Pulmonary/Chest: Effort normal and breath sounds normal. No stridor. No respiratory distress.  Non productive coughs frequently during exam.   Abdominal: Soft. Bowel sounds are normal. He exhibits no distension. There is no tenderness. There is no guarding.  Musculoskeletal: He exhibits no edema or deformity.  Lymphadenopathy:    He has no cervical adenopathy.  Neurological: He is alert and oriented to person, place, and time. He exhibits normal muscle tone.  Skin: Skin is warm and dry. He is not diaphoretic.  Psychiatric: He has a normal mood and affect. His behavior is normal.  Nursing note and vitals reviewed.    ED Treatments / Results  Labs (all labs ordered are listed, but only abnormal results are displayed) Labs Reviewed  COMPREHENSIVE METABOLIC PANEL - Abnormal; Notable for the following components:      Result Value   CO2 21 (*)    Glucose, Bld 123 (*)    Calcium 8.4 (*)    Total Protein 6.2 (*)    AST 152 (*)    ALT 104 (*)    All other components within normal limits  CBC WITH DIFFERENTIAL/PLATELET - Abnormal; Notable for the following components:   RBC 4.20 (*)    Lymphs Abs 0.6 (*)    All other components within normal limits  I-STAT CG4 LACTIC ACID, ED  I-STAT CG4 LACTIC ACID, ED    EKG  EKG Interpretation None       Radiology Dg Chest 2 View  Result Date: 02/15/2017 CLINICAL DATA:  Acute onset of fever and nonproductive cough. EXAM: CHEST  2 VIEW COMPARISON:  None. FINDINGS: The lungs are well-aerated and clear. There is no evidence of focal opacification, pleural effusion or pneumothorax. The heart is normal in size; the mediastinal contour is within normal limits. No acute osseous abnormalities are seen. IMPRESSION: No acute cardiopulmonary process seen. Electronically Signed   By: Garald Balding M.D.   On:  02/15/2017 00:04    Procedures Procedures (including critical care time)  Medications Ordered in ED Medications  acetaminophen (TYLENOL) tablet 650 mg (650 mg Oral Given 02/14/17 2338)  ibuprofen (ADVIL,MOTRIN) tablet 800 mg (800 mg Oral Given 02/15/17 0400)     Initial Impression / Assessment and Plan / ED Course  I have reviewed the triage vital signs and the nursing notes.  Pertinent labs & imaging results that were available during my care of the patient were reviewed by me and considered in my medical decision making (see chart for details).    Patient with symptoms consistent with influenza.  Vitals are stable, low-grade fever.  No signs of dehydration, tolerating PO's.  Lungs are clear, CXR with out consolidation or abnormalities Discussed the cost versus benefit of Tamiflu treatment with the patient.  Who elected to  have tx for tamiflu.  Patient will be discharged with instructions to orally hydrate, rest, and use over-the-counter medications such as anti-inflammatories ibuprofen and Aleve for muscle aches.  He was informed of elevated LFT.  He does not have previous LFT on chart review, abdomen is soft, non tender, normal alk phos and bilirubin.  No history of prior hepatitis.  He was instructed to avoid alcohol and tylenol and to follow up with his PCP.    Acute hepatitis pannel drawn which can be follow up by [facility].  Patient was instructed on report of care, and return precautions and states his understanding.    Final Clinical Impressions(s) / ED Diagnoses   Final diagnoses:  Transaminitis  Influenza-like illness  Cough    ED Discharge Orders    None       Lorin Glass, PA-C 02/15/17 1712    Ward, Delice Bison, DO 02/18/17 0040

## 2017-02-16 LAB — HEPATITIS PANEL, ACUTE
HCV Ab: <0.1 s/co ratio (ref 0.0–0.9)
Hep A IgM: NEGATIVE
Hep B C IgM: NEGATIVE
Hepatitis B Surface Ag: NEGATIVE

## 2017-02-18 ENCOUNTER — Ambulatory Visit (HOSPITAL_COMMUNITY)
Admission: EM | Admit: 2017-02-18 | Discharge: 2017-02-18 | Disposition: A | Discharge status: Home / Self Care | Payer: Self-pay | Attending: Internal Medicine | Admitting: Internal Medicine

## 2017-02-18 ENCOUNTER — Encounter (HOSPITAL_COMMUNITY): Payer: Self-pay | Admitting: Emergency Medicine

## 2017-02-18 DIAGNOSIS — H1033 Unspecified acute conjunctivitis, bilateral: Secondary | ICD-10-CM

## 2017-02-18 MED ORDER — POLYETHYL GLYCOL-PROPYL GLYCOL 0.4-0.3 % OP GEL: 1.0000 "application " | Freq: Every evening | OPHTHALMIC | 0 refills | Status: DC | PRN

## 2017-02-18 MED ORDER — OFLOXACIN 0.3 % OP SOLN: OPHTHALMIC | 0 refills | Status: DC

## 2017-02-18 NOTE — ED Provider Notes (Signed)
[facility]    CSN: [id number] Arrival date & time: 02/18/17  1912     History   Chief Complaint Chief Complaint  Patient presents with  . Eye Pain    HPI Anthony Gutierrez is a 42 y.o. male.   42 year old male comes in for 3-4-day history of bilateral eye redness, irritation, discharge.  He has had crusting in the morning as well.  Denies changes in vision, photophobia, injury or trauma.  He was recently seen for viral illness, and is being treated for the flu.  OTC Visine drops, which made symptoms worse.  Denies history of contact lens use, glasses use.       History reviewed. No pertinent past medical history.  Patient Active Problem List   Diagnosis Date Noted  . Pain in right wrist 01/09/2016    History reviewed. No pertinent surgical history.     Home Medications    Prior to Admission medications   Medication Sig Start Date End Date Taking? Authorizing Provider  oseltamivir (TAMIFLU) 75 MG capsule Take 1 capsule (75 mg total) by mouth every 12 (twelve) hours. 02/15/17  Yes Cristina GongHammond, Elizabeth W, PA-C  benzonatate (TESSALON) 100 MG capsule Take 1 capsule (100 mg total) by mouth every 8 (eight) hours. 04/05/16   Dorena BodoKennard, Lawrence, NP  diclofenac (VOLTAREN) 75 MG EC tablet Take 1 tablet (75 mg total) by mouth 2 (two) times daily. 04/05/16   Dorena BodoKennard, Lawrence, NP  ibuprofen (ADVIL,MOTRIN) 800 MG tablet Take 800 mg by mouth every 8 (eight) hours as needed.    [provider]  ofloxacin (OCUFLOX) 0.3 % ophthalmic solution 1 drop every 4 hours for 2 days, then 4 times a day for 5 days. 02/18/17   Cathie HoopsYu, Celia Friedland V, PA-C  ondansetron (ZOFRAN ODT) 4 MG disintegrating tablet Take 1 tablet (4 mg total) by mouth every 8 (eight) hours as needed for nausea or vomiting. 04/05/16   Dorena BodoKennard, Lawrence, NP  Polyethyl Glycol-Propyl Glycol (SYSTANE) 0.4-0.3 % GEL ophthalmic gel Place 1 application into both eyes at bedtime as needed. 02/18/17   Belinda FisherYu, Itsel Opfer V, PA-C    Family  History History reviewed. No pertinent family history.  Social History Social History   Tobacco Use  . Smoking status: Current Every Day Smoker    Packs/day: 0.10    Types: Cigarettes  . Smokeless tobacco: Never Used  Substance Use Topics  . Alcohol use: Yes    Comment: occasional  . Drug use: No     Allergies   Patient has no known allergies.   Review of Systems Review of Systems  Reason unable to perform ROS: See HPI as above.     Physical Exam Triage Vital Signs ED Triage Vitals  Enc Vitals Group     BP 02/18/17 2035 111/61     Pulse Rate 02/18/17 2035 66     Resp 02/18/17 2035 (!) 22     Temp 02/18/17 2035 97.9 F (36.6 C)     Temp Source 02/18/17 2035 Oral     SpO2 02/18/17 2035 96 %     Weight --      Height --      Head Circumference --      Peak Flow --      Pain Score 02/18/17 2037 6     Pain Loc --      Pain Edu? --      Excl. in GC? --    No data found.  Updated Vital Signs BP 111/61 (  BP Location: Left Arm)   Pulse 66   Temp 97.9 F (36.6 C) (Oral)   Resp (!) 22   SpO2 96%   Visual Acuity Right Eye Distance:   20/20 ([place]) Left Eye Distance:   20/20 ([place]) Bilateral Distance:    Right Eye Near:   Left Eye Near:    Bilateral Near:     Physical Exam  Constitutional: He is oriented to person, place, and time. He appears well-developed and well-nourished. No distress.  HENT:  Head: Normocephalic and atraumatic.  Eyes: EOM and lids are normal. Pupils are equal, round, and reactive to light. Lids are everted and swept, no foreign bodies found. Right conjunctiva is injected. Left conjunctiva is injected.  No ciliary injection.  Neck: Normal range of motion. Neck supple.  Neurological: He is alert and oriented to person, place, and time.  Skin: Skin is warm and dry.    UC Treatments / Results  Labs (all labs ordered are listed, but only abnormal results are displayed) Labs Reviewed - No data to display  EKG  EKG Interpretation None        Radiology No results found.  Procedures Procedures (including critical care time)  Medications Ordered in UC Medications - No data to display   Initial Impression / Assessment and Plan / UC Course  I have reviewed the triage vital signs and the nursing notes.  Pertinent labs & imaging results that were available during my care of the patient were reviewed by me and considered in my medical decision making (see chart for details).    Start ofloxacin drops as directed. Artificial tears gel as directed. Lid scrubs and warm compresses as directed. Patient to follow up with ophthalmology if symptoms worsens or does not improve. Return precautions given.    Final Clinical Impressions(s) / UC Diagnoses   Final diagnoses:  Acute conjunctivitis of both eyes, unspecified acute conjunctivitis type    ED Discharge Orders        Ordered    ofloxacin (OCUFLOX) 0.3 % ophthalmic solution     02/18/17 2134    Polyethyl Glycol-Propyl Glycol (SYSTANE) 0.4-0.3 % GEL ophthalmic gel  At bedtime PRN     02/18/17 2134        Belinda Fisher, PA-C 02/18/17 2136

## 2020-09-11 ENCOUNTER — Other Ambulatory Visit: Payer: Self-pay | Admitting: Family Medicine

## 2020-09-11 ENCOUNTER — Ambulatory Visit: Payer: Self-pay

## 2020-09-11 ENCOUNTER — Other Ambulatory Visit: Payer: Self-pay

## 2020-09-11 DIAGNOSIS — M545 Low back pain, unspecified: Secondary | ICD-10-CM

## 2020-12-02 ENCOUNTER — Other Ambulatory Visit: Payer: Self-pay | Admitting: Orthopedic Surgery

## 2020-12-02 DIAGNOSIS — M533 Sacrococcygeal disorders, not elsewhere classified: Secondary | ICD-10-CM

## 2020-12-15 ENCOUNTER — Inpatient Hospital Stay: Admission: RE | Admit: 2020-12-15 | Payer: Self-pay | Source: Ambulatory Visit

## 2020-12-15 ENCOUNTER — Other Ambulatory Visit: Payer: Self-pay

## 2021-02-03 ENCOUNTER — Ambulatory Visit
Admission: RE | Admit: 2021-02-03 | Discharge: 2021-02-03 | Disposition: A | Discharge status: Home / Self Care | Payer: Worker's Compensation | Source: Ambulatory Visit | Attending: Orthopedic Surgery | Admitting: Orthopedic Surgery

## 2021-02-03 ENCOUNTER — Ambulatory Visit
Admission: RE | Admit: 2021-02-03 | Discharge: 2021-02-03 | Disposition: A | Discharge status: Home / Self Care | Payer: Self-pay | Source: Ambulatory Visit | Attending: Orthopedic Surgery | Admitting: Orthopedic Surgery

## 2021-02-03 ENCOUNTER — Other Ambulatory Visit: Payer: Self-pay

## 2021-02-03 DIAGNOSIS — M533 Sacrococcygeal disorders, not elsewhere classified: Secondary | ICD-10-CM

## 2021-03-04 ENCOUNTER — Other Ambulatory Visit: Payer: Self-pay | Admitting: Orthopedic Surgery

## 2021-03-04 DIAGNOSIS — M545 Low back pain, unspecified: Secondary | ICD-10-CM

## 2021-03-04 DIAGNOSIS — G8929 Other chronic pain: Secondary | ICD-10-CM

## 2021-03-24 ENCOUNTER — Other Ambulatory Visit: Payer: Self-pay

## 2021-03-24 ENCOUNTER — Ambulatory Visit
Admission: RE | Admit: 2021-03-24 | Discharge: 2021-03-24 | Disposition: A | Discharge status: Home / Self Care | Payer: Worker's Compensation | Source: Ambulatory Visit | Attending: Orthopedic Surgery | Admitting: Orthopedic Surgery

## 2021-03-24 DIAGNOSIS — M545 Low back pain, unspecified: Secondary | ICD-10-CM

## 2021-03-24 MED ORDER — METHYLPREDNISOLONE ACETATE 80 MG/ML IJ SUSP
80.0000 mg | Freq: Once | INTRAMUSCULAR | Status: AC
  Administered 2021-03-24: 80 mg via INTRA_ARTICULAR

## 2021-07-27 ENCOUNTER — Encounter (HOSPITAL_COMMUNITY): Payer: Self-pay | Admitting: Emergency Medicine

## 2021-07-27 ENCOUNTER — Emergency Department (HOSPITAL_COMMUNITY)
Admission: EM | Admit: 2021-07-27 | Discharge: 2021-07-27 | Disposition: A | Discharge status: Home / Self Care | Payer: Self-pay | Attending: Emergency Medicine | Admitting: Emergency Medicine

## 2021-07-27 ENCOUNTER — Other Ambulatory Visit: Payer: Self-pay

## 2021-07-27 ENCOUNTER — Emergency Department (HOSPITAL_COMMUNITY): Payer: Self-pay

## 2021-07-27 DIAGNOSIS — M542 Cervicalgia: Secondary | ICD-10-CM | POA: Insufficient documentation

## 2021-07-27 DIAGNOSIS — Z79899 Other long term (current) drug therapy: Secondary | ICD-10-CM | POA: Insufficient documentation

## 2021-07-27 DIAGNOSIS — R251 Tremor, unspecified: Secondary | ICD-10-CM | POA: Insufficient documentation

## 2021-07-27 LAB — CBC WITH DIFFERENTIAL/PLATELET
Abs Immature Granulocytes: 0.04 10*3/uL (ref 0.00–0.07)
Basophils Absolute: 0 10*3/uL (ref 0.0–0.1)
Basophils Relative: 1 %
Eosinophils Absolute: 0.1 10*3/uL (ref 0.0–0.5)
Eosinophils Relative: 2 %
HCT: 44.9 % (ref 39.0–52.0)
Hemoglobin: 15.6 g/dL (ref 13.0–17.0)
Immature Granulocytes: 1 %
Lymphocytes Relative: 23 %
Lymphs Abs: 1.8 10*3/uL (ref 0.7–4.0)
MCH: 33.5 pg (ref 26.0–34.0)
MCHC: 34.7 g/dL (ref 30.0–36.0)
MCV: 96.6 fL (ref 80.0–100.0)
Monocytes Absolute: 0.7 10*3/uL (ref 0.1–1.0)
Monocytes Relative: 9 %
Neutro Abs: 5.3 10*3/uL (ref 1.7–7.7)
Neutrophils Relative %: 64 %
Platelets: 273 10*3/uL (ref 150–400)
RBC: 4.65 MIL/uL (ref 4.22–5.81)
RDW: 11.9 % (ref 11.5–15.5)
WBC: 8 10*3/uL (ref 4.0–10.5)
nRBC: 0 % (ref 0.0–0.2)

## 2021-07-27 LAB — ETHANOL: Alcohol, Ethyl (B): <10 mg/dL (ref <10)

## 2021-07-27 LAB — COMPREHENSIVE METABOLIC PANEL
ALT: 36 U/L (ref 0–44)
AST: 25 U/L (ref 15–41)
Albumin: 3.9 g/dL (ref 3.5–5.0)
Alkaline Phosphatase: 48 U/L (ref 38–126)
Anion gap: 10 (ref 5–15)
BUN: 12 mg/dL (ref 6–20)
CO2: 23 mmol/L (ref 22–32)
Calcium: 9.3 mg/dL (ref 8.9–10.3)
Chloride: 105 mmol/L (ref 98–111)
Creatinine, Ser: 0.88 mg/dL (ref 0.61–1.24)
GFR, Estimated: >60 mL/min (ref >60)
Glucose, Bld: 110 mg/dL — ABNORMAL HIGH (ref 70–99)
Potassium: 3.9 mmol/L (ref 3.5–5.1)
Sodium: 138 mmol/L (ref 135–145)
Total Bilirubin: 1 mg/dL (ref 0.3–1.2)
Total Protein: 6.8 g/dL (ref 6.5–8.1)

## 2021-07-27 LAB — URINALYSIS, ROUTINE W REFLEX MICROSCOPIC
Bilirubin Urine: NEGATIVE
Glucose, UA: NEGATIVE mg/dL
Hgb urine dipstick: NEGATIVE
Ketones, ur: NEGATIVE mg/dL
Leukocytes,Ua: NEGATIVE
Nitrite: NEGATIVE
Protein, ur: NEGATIVE mg/dL
Specific Gravity, Urine: 1.018 (ref 1.005–1.030)
pH: 7 (ref 5.0–8.0)

## 2021-07-27 LAB — RAPID URINE DRUG SCREEN, HOSP PERFORMED
Amphetamines: NOT DETECTED
Barbiturates: NOT DETECTED
Benzodiazepines: NOT DETECTED
Cocaine: NOT DETECTED
Opiates: NOT DETECTED
Tetrahydrocannabinol: NOT DETECTED

## 2021-07-27 LAB — MAGNESIUM: Magnesium: 2.1 mg/dL (ref 1.7–2.4)

## 2021-07-27 NOTE — ED Notes (Signed)
RN reviewed discharge instructions with pt. Pt verbalized understanding and had no further questions. VSS upon discharge.  

## 2021-07-27 NOTE — ED Provider Triage Note (Signed)
Emergency Medicine Provider Triage Evaluation Note  Anthony Gutierrez , a 46 y.o. male  was evaluated in triage.  Pt complains of tremors. States that while he was at work today he started noticing that he had bilateral upper extremity involuntary shaking as well as tremors in his mouth.  He was aware during the entire episode.  It lasted a few minutes and went away.  He has had 3 separate episodes of this today.  He says when it comes on he has associated neck and headache.  Ever had the symptoms before.  He is not on any medications, denies any past medical history, never had a seizure.  No other chest pain, shortness of breath, numbness, weakness, speech changes, vision deficits, dizziness, gait abnormalities.  Review of Systems  Positive: Tremors, headache, neck pain Negative:   Physical Exam  There were no vitals taken for this visit. Gen:   Awake, no distress   Resp:  Normal effort  MSK:   Moves extremities without difficulty  Other:  There is tenderness of the posterior neck in the paraspinal musculature. 2 + radial pulses bilaterally. Moves all ext without difficulty.  No notable tremors on exam.   Medical Decision Making  Medically screening exam initiated at 11:30 AM.  Appropriate orders placed.  Anthony Gutierrez was informed that the remainder of the evaluation will be completed by another provider, this initial triage assessment does not replace that evaluation, and the importance of remaining in the ED until their evaluation is complete.     Claudie Leach, [place] 07/27/21 1138
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
## 2021-07-27 NOTE — Discharge Instructions (Signed)
Your CT and labs were all normal today.  Given this, I suspect that the tremor that you had was secondary to drinking coffee, Diet Coke and then also smoking a cigarette.  Please go home and drink plenty of water and get some rest.  Feel free to return if symptoms come back.

## 2022-08-10 ENCOUNTER — Ambulatory Visit (INDEPENDENT_AMBULATORY_CARE_PROVIDER_SITE_OTHER): Payer: Self-pay

## 2022-08-10 ENCOUNTER — Ambulatory Visit (HOSPITAL_COMMUNITY)
Admission: EM | Admit: 2022-08-10 | Discharge: 2022-08-10 | Disposition: A | Discharge status: Home / Self Care | Payer: Self-pay | Attending: Family Medicine | Admitting: Family Medicine

## 2022-08-10 ENCOUNTER — Encounter (HOSPITAL_COMMUNITY): Payer: Self-pay | Admitting: *Deleted

## 2022-08-10 DIAGNOSIS — Z1152 Encounter for screening for COVID-19: Secondary | ICD-10-CM | POA: Insufficient documentation

## 2022-08-10 DIAGNOSIS — M25522 Pain in left elbow: Secondary | ICD-10-CM

## 2022-08-10 DIAGNOSIS — R0789 Other chest pain: Secondary | ICD-10-CM | POA: Insufficient documentation

## 2022-08-10 DIAGNOSIS — R200 Anesthesia of skin: Secondary | ICD-10-CM | POA: Insufficient documentation

## 2022-08-10 DIAGNOSIS — K429 Umbilical hernia without obstruction or gangrene: Secondary | ICD-10-CM | POA: Insufficient documentation

## 2022-08-10 MED ORDER — PREDNISONE 20 MG PO TABS: 40.0000 mg | ORAL_TABLET | Freq: Every day | ORAL | 0 refills | Status: AC

## 2022-08-11 LAB — SARS CORONAVIRUS 2 (TAT 6-24 HRS): SARS Coronavirus 2: NEGATIVE

## 2023-06-29 IMAGING — CT CT BIOPSY
1 of 3 series · 12 of 32 positions shown, 18 images · non-contrast
Comparison: none

CLINICAL DATA: Relief from previous right sacroiliac anesthetic
injection. The patient was expecting to have his left sacroiliac
joint injected today, but we discussed the order for right
sacroiliac steroid injection. He may desire subsequent evaluation
for left sacroiliac region pain.

[Series 2: needle -guided injection · axial · 0.77mm/px · z∈[+894,+944]mm · 12 of 31 slices shown, 18 images]
[im 3/31  soft-tissue]
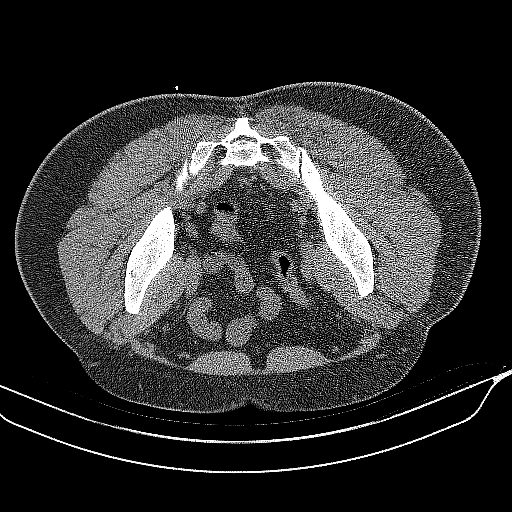
[im 3/31  bone]
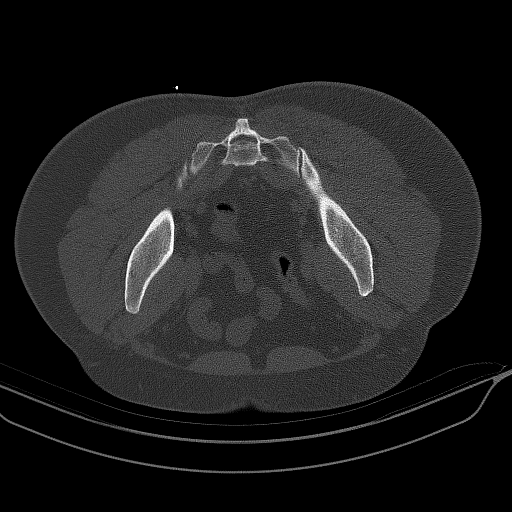
[im 5/31  soft-tissue]
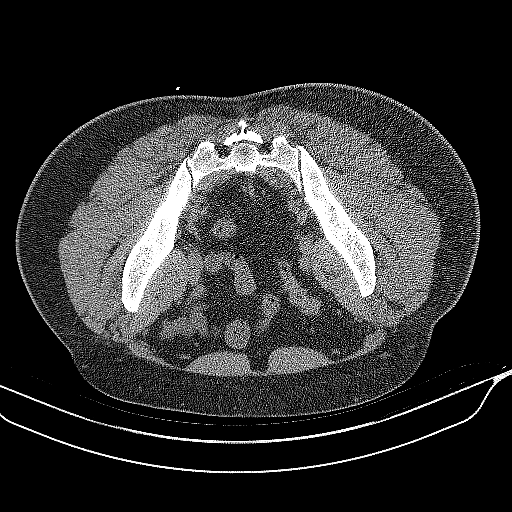
[im 7/31  soft-tissue]
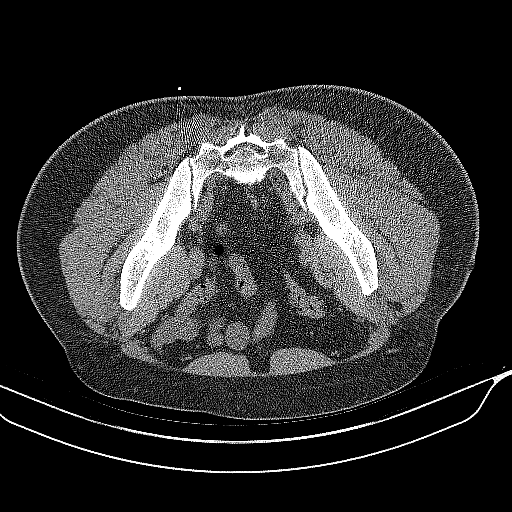
[im 10/31  soft-tissue]
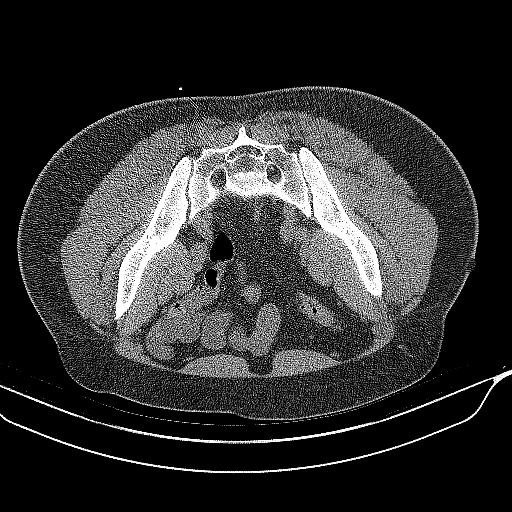
[im 12/31  soft-tissue]
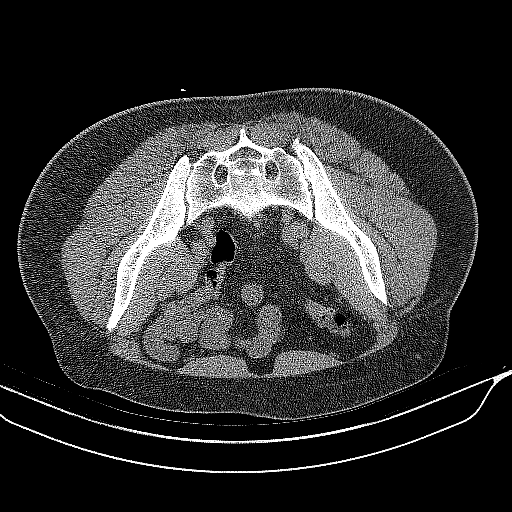
[im 14/31  soft-tissue]
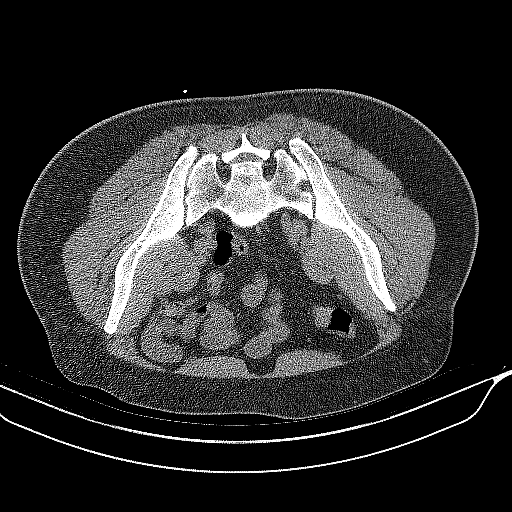
[im 17/31  soft-tissue]
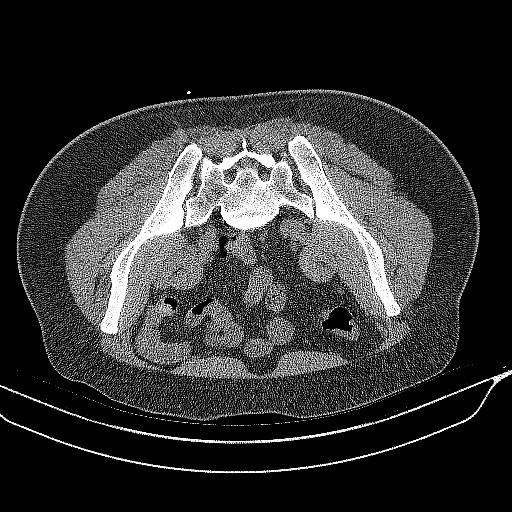
[im 19/31  soft-tissue]
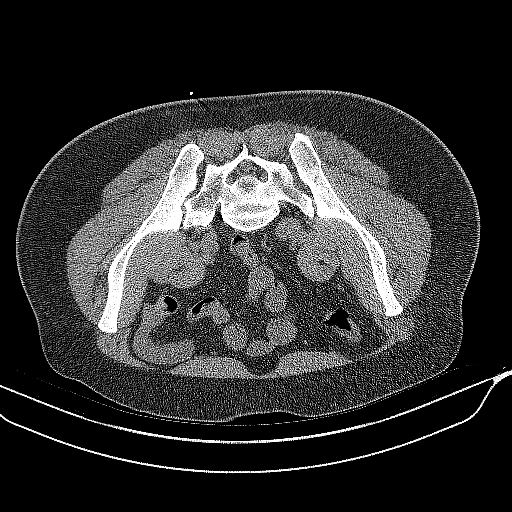
[im 21/31  soft-tissue]
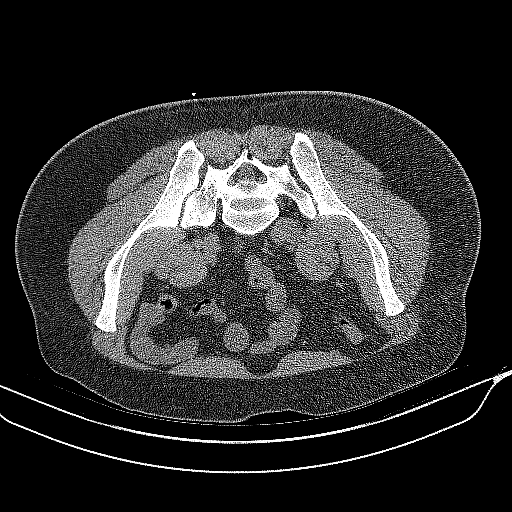
[im 21/31  lung]
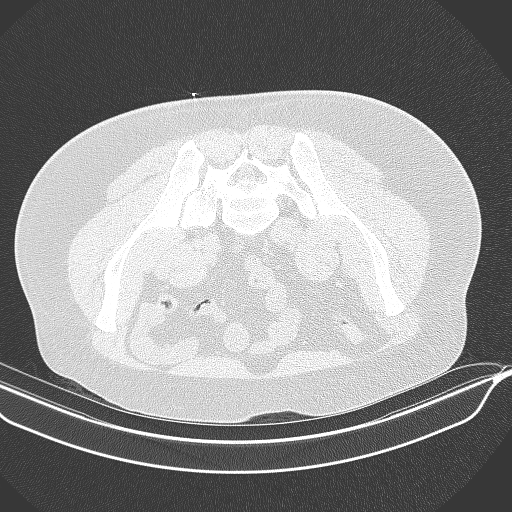
[im 21/31  bone]
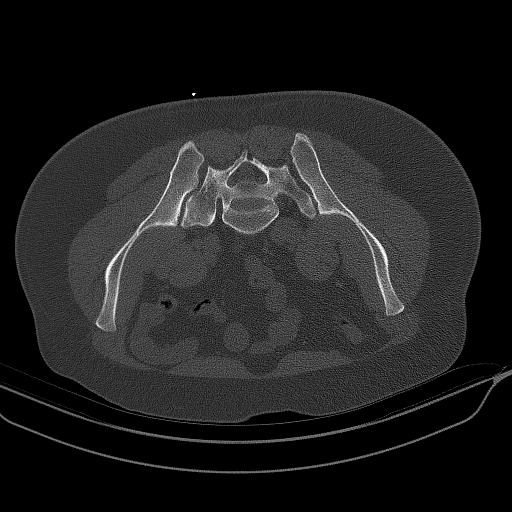
[im 24/31  soft-tissue]
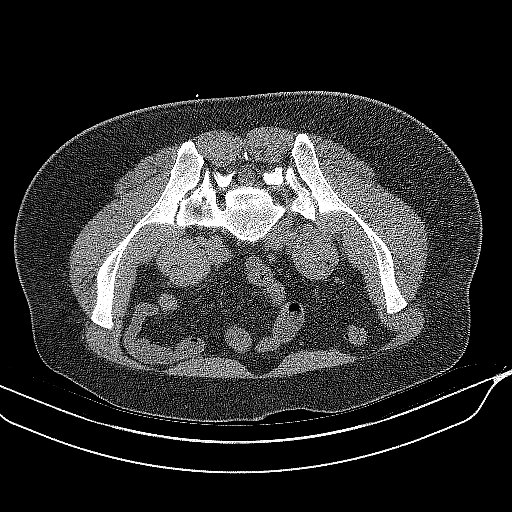
[im 24/31  lung]
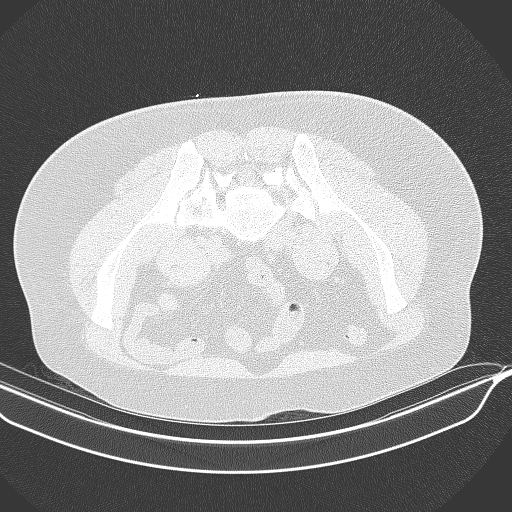
[im 26/31  soft-tissue]
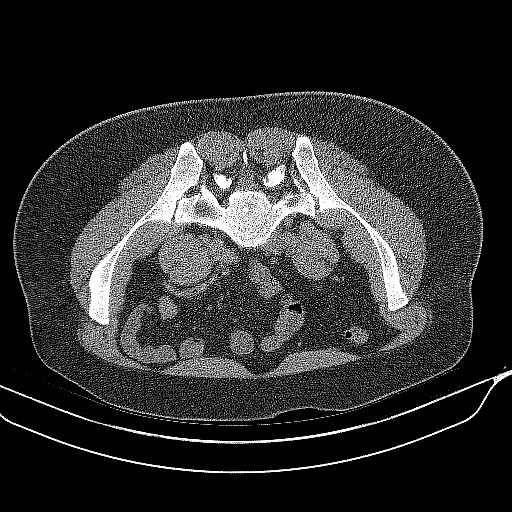
[im 26/31  lung]
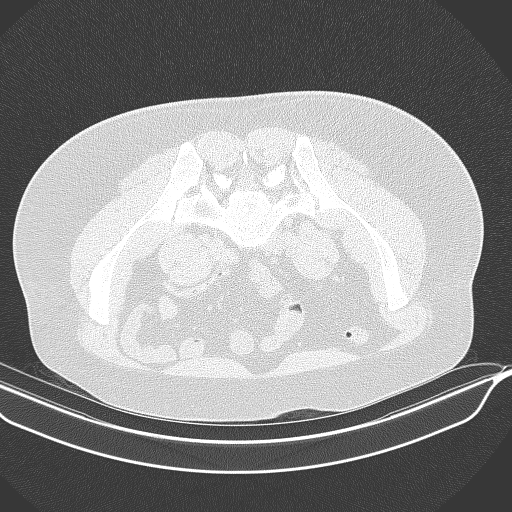
[im 28/31  soft-tissue]
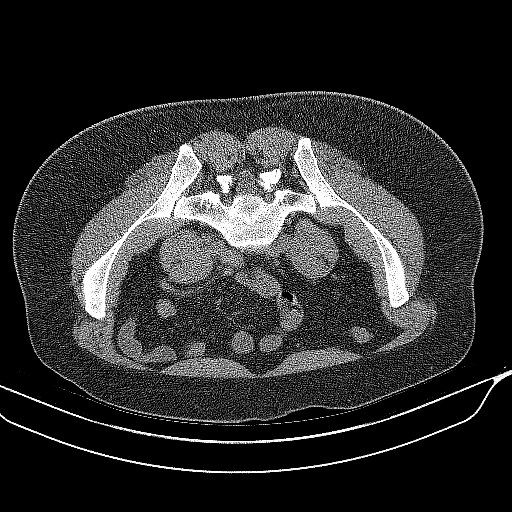
[im 28/31  lung]
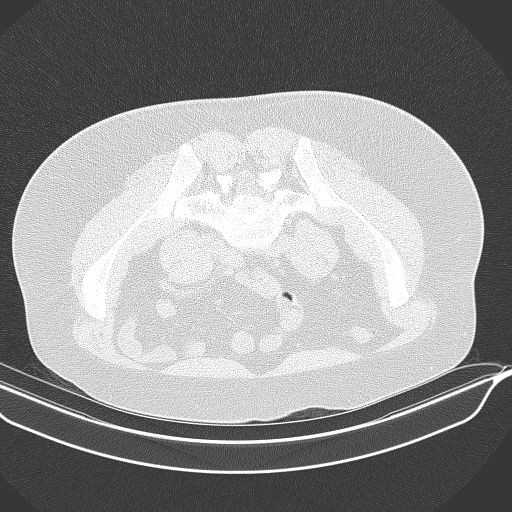

[12 of 32 positions shown; findings below may reference images not displayed]

EXAM:
Right CT GUIDED SI JOINT INJECTION

RADIATION DOSE REDUCTION: This exam was performed according to the
departmental dose-optimization program which includes automated
exposure control, adjustment of the mA and/or kV according to
patient size and/or use of iterative reconstruction technique.



After local anesthesia with 1% lidocaine without epinephrine and
subsequent deep anesthesia, a 22 gauge spinal needle was advanced
into the right SI joint under intermittent CT guidance.

Once the needle was in satisfactory position, representative image
was captured with the needle demonstrated in the sacroiliac joint.
Subsequently, 80 mg Depo-Medrol mixed with 0.5 cc 0.5% bupivacaine
was injected into the right SI joint. Needles removed and a sterile
dressing applied.

No complications were observed.
IMPRESSION: Successful CT-guided right SI joint injection. 80 mg Depo-Medrol and
0.5 cc 0.5% bupivacaine were instilled.

## 2023-07-09 ENCOUNTER — Emergency Department (HOSPITAL_COMMUNITY)
Admission: EM | Admit: 2023-07-09 | Discharge: 2023-07-09 | Disposition: A | Discharge status: Home / Self Care | Attending: Emergency Medicine | Admitting: Emergency Medicine

## 2023-07-09 ENCOUNTER — Emergency Department (HOSPITAL_COMMUNITY)

## 2023-07-09 ENCOUNTER — Other Ambulatory Visit: Payer: Self-pay

## 2023-07-09 ENCOUNTER — Encounter (HOSPITAL_COMMUNITY): Payer: Self-pay

## 2023-07-09 DIAGNOSIS — K625 Hemorrhage of anus and rectum: Secondary | ICD-10-CM | POA: Diagnosis present

## 2023-07-09 DIAGNOSIS — K429 Umbilical hernia without obstruction or gangrene: Secondary | ICD-10-CM | POA: Insufficient documentation

## 2023-07-09 LAB — POC OCCULT BLOOD, ED: Fecal Occult Bld: POSITIVE — AB

## 2023-07-09 LAB — COMPREHENSIVE METABOLIC PANEL WITH GFR
ALT: 26 U/L (ref 0–44)
AST: 40 U/L (ref 15–41)
Albumin: 4 g/dL (ref 3.5–5.0)
Alkaline Phosphatase: 44 U/L (ref 38–126)
Anion gap: 11 (ref 5–15)
BUN: 10 mg/dL (ref 6–20)
CO2: 21 mmol/L — ABNORMAL LOW (ref 22–32)
Calcium: 8.9 mg/dL (ref 8.9–10.3)
Chloride: 102 mmol/L (ref 98–111)
Creatinine, Ser: 0.54 mg/dL — ABNORMAL LOW (ref 0.61–1.24)
GFR, Estimated: >60 mL/min (ref >60)
Glucose, Bld: 128 mg/dL — ABNORMAL HIGH (ref 70–99)
Potassium: 4.8 mmol/L (ref 3.5–5.1)
Sodium: 134 mmol/L — ABNORMAL LOW (ref 135–145)
Total Bilirubin: 1.2 mg/dL (ref 0.0–1.2)
Total Protein: 7 g/dL (ref 6.5–8.1)

## 2023-07-09 LAB — CBC
HCT: 45.2 % (ref 39.0–52.0)
Hemoglobin: 15.3 g/dL (ref 13.0–17.0)
MCH: 32.8 pg (ref 26.0–34.0)
MCHC: 33.8 g/dL (ref 30.0–36.0)
MCV: 96.8 fL (ref 80.0–100.0)
Platelets: 314 10*3/uL (ref 150–400)
RBC: 4.67 MIL/uL (ref 4.22–5.81)
RDW: 12.1 % (ref 11.5–15.5)
WBC: 7.8 10*3/uL (ref 4.0–10.5)
nRBC: 0 % (ref 0.0–0.2)

## 2023-07-09 LAB — ABO/RH: ABO/RH(D): A POS

## 2023-07-09 LAB — TYPE AND SCREEN
ABO/RH(D): A POS
Antibody Screen: NEGATIVE

## 2023-07-09 MED ORDER — IOHEXOL 350 MG/ML SOLN
75.0000 mL | Freq: Once | INTRAVENOUS | Status: AC | PRN
  Administered 2023-07-09: 75 mL via INTRAVENOUS

## 2023-07-09 NOTE — ED Provider Notes (Signed)
 " [facility] Provider Note   CSN: [id number] Arrival date & time: 07/09/23  9158     Patient presents with: Rectal Bleeding   Anthony Gutierrez is a 48 y.o. male who presents to the ED today for bright red blood per rectum over the last 9 to 10 days.  He states he has had multiple loose stools with noted bright red blood in his stool and on toilet paper when cleaning himself.  He also states that he has an abdominal hernia and has periumbilical discomfort.  Review of previous records does not show any previous diagnosis of abdominal hernia.  He does endorse dizziness with standing otherwise does not have dizziness or vertigo at baseline, further denies any nausea or vomiting.    Rectal Bleeding Associated symptoms: abdominal pain        Prior to Admission medications   Medication Sig Start Date End Date Taking? Authorizing Provider  benzonatate  (TESSALON ) 100 MG capsule Take 1 capsule (100 mg total) by mouth every 8 (eight) hours. 04/05/16   Leann Satterfield, NP  diclofenac  (VOLTAREN ) 75 MG EC tablet Take 1 tablet (75 mg total) by mouth 2 (two) times daily. 04/05/16   Leann Satterfield, NP  ibuprofen  (ADVIL ,MOTRIN ) 800 MG tablet Take 800 mg by mouth every 8 (eight) hours as needed.    [provider]  ofloxacin  (OCUFLOX ) 0.3 % ophthalmic solution 1 drop every 4 hours for 2 days, then 4 times a day for 5 days. 02/18/17   Babara, Amy V, PA-C  ondansetron  (ZOFRAN  ODT) 4 MG disintegrating tablet Take 1 tablet (4 mg total) by mouth every 8 (eight) hours as needed for nausea or vomiting. 04/05/16   Leann Satterfield, NP  oseltamivir  (TAMIFLU ) 75 MG capsule Take 1 capsule (75 mg total) by mouth every 12 (twelve) hours. 02/15/17   Windle Almarie ORN, PA-C  Polyethyl Glycol-Propyl Glycol (SYSTANE) 0.4-0.3 % GEL ophthalmic gel Place 1 application into both eyes at bedtime as needed. 02/18/17   Babara Greig GAILS, PA-C    Allergies: Patient has no known allergies.     Review of Systems  Gastrointestinal:  Positive for abdominal pain, blood in stool and hematochezia.  All other systems reviewed and are negative.   Updated Vital Signs BP 110/73   Pulse (!) 54   Temp 97.7 F (36.5 C)   Resp 17   Ht 5' 7 (1.702 m)   Wt 84.8 kg   SpO2 95%   BMI 29.29 kg/m   Physical Exam Vitals and nursing note reviewed. Exam conducted with a chaperone present.  Constitutional:      General: He is not in acute distress.    Appearance: Normal appearance.  HENT:     Head: Normocephalic and atraumatic.     Mouth/Throat:     Mouth: Mucous membranes are moist.     Pharynx: Oropharynx is clear.   Eyes:     Extraocular Movements: Extraocular movements intact.     Conjunctiva/sclera: Conjunctivae normal.     Pupils: Pupils are equal, round, and reactive to light.    Cardiovascular:     Rate and Rhythm: Normal rate and regular rhythm.     Pulses: Normal pulses.     Heart sounds: Normal heart sounds. No murmur heard.    No friction rub. No gallop.  Pulmonary:     Effort: Pulmonary effort is normal.     Breath sounds: Normal breath sounds.  Abdominal:     General: Abdomen  is flat. Bowel sounds are normal. There is no distension.     Palpations: Abdomen is soft. There is no hepatomegaly or splenomegaly.     Tenderness: There is abdominal tenderness in the right lower quadrant and periumbilical area.     Hernia: A hernia is present. Hernia is present in the umbilical area.  Genitourinary:    Prostate: Normal.     Rectum: Guaiac result positive.   Musculoskeletal:        General: Normal range of motion.     Cervical back: Normal range of motion and neck supple.     Right lower leg: No edema.     Left lower leg: No edema.   Skin:    General: Skin is warm and dry.     Capillary Refill: Capillary refill takes less than 2 seconds.   Neurological:     General: No focal deficit present.     Mental Status: He is alert. Mental status is at baseline.    Psychiatric:        Mood and Affect: Mood normal.     (all labs ordered are listed, but only abnormal results are displayed) Labs Reviewed  COMPREHENSIVE METABOLIC PANEL WITH GFR - Abnormal; Notable for the following components:      Result Value   Sodium 134 (*)    CO2 21 (*)    Glucose, Bld 128 (*)    Creatinine, Ser 0.54 (*)    All other components within normal limits  POC OCCULT BLOOD, ED - Abnormal; Notable for the following components:   Fecal Occult Bld POSITIVE (*)    All other components within normal limits  CBC  TYPE AND SCREEN  ABO/RH    EKG: None  Radiology: CT ANGIO GI BLEED Result Date: 07/09/2023 CLINICAL DATA:  Lower GI bleed, positive guaiac. EXAM: CTA ABDOMEN AND PELVIS WITHOUT AND WITH CONTRAST TECHNIQUE: Multidetector CT imaging of the abdomen and pelvis was performed using the standard protocol during bolus administration of intravenous contrast. Multiplanar reconstructed images and MIPs were obtained and reviewed to evaluate the vascular anatomy. RADIATION DOSE REDUCTION: This exam was performed according to the departmental dose-optimization program which includes automated exposure control, adjustment of the mA and/or kV according to patient size and/or use of iterative reconstruction technique. CONTRAST:  75mL OMNIPAQUE  IOHEXOL  350 MG/ML SOLN COMPARISON:  None Available. FINDINGS: VASCULAR Aorta: Normal caliber aorta without aneurysm, dissection, vasculitis or significant stenosis. Celiac: Patent without evidence of aneurysm, dissection, vasculitis or significant stenosis. SMA: Patent without evidence of aneurysm, dissection, vasculitis or significant stenosis. Renals: Single right renal artery. Two left renal arteries. Renal arteries are patent without significant stenosis. No significant aneurysm or dissection involving the renal arteries. IMA: Patent without evidence of aneurysm, dissection, vasculitis or significant stenosis. Inflow: Patent without  evidence of aneurysm, dissection, vasculitis or significant stenosis. Proximal Outflow: Proximal femoral arteries are patent bilaterally. Veins: Portal venous system is patent. No gross abnormality to the IVC. Mild compression on the left common iliac vein from the right common iliac artery and this is a normal variant. Review of the MIP images confirms the above findings. NON-VASCULAR Lower chest: Lung bases are clear. Hepatobiliary: Normal appearance of the liver and gallbladder. No biliary dilatation. Pancreas: Unremarkable. No pancreatic ductal dilatation or surrounding inflammatory changes. Spleen: Normal in size without focal abnormality. Adrenals/Urinary Tract: Normal adrenal glands. 4 mm stone in the left kidney interpolar region. No hydronephrosis. No suspicious renal lesion. Mild distention of the urinary bladder. Stomach/Bowel: Small hiatal  hernia. Normal appearance of the stomach. No bowel dilatation or obstruction. Normal appendix. Hyperemia noted along the left side of the lower rectum and anus but there is no evidence for active bleeding based on the venous phase imaging. No focal bowel inflammation. Lymphatic: No lymph node enlargement in the abdomen or pelvis. Reproductive: Prostate is unremarkable. Other: Right periumbilical hernia containing fat. The abdominal wall defect measures roughly 1.6 cm. Mild fat stranding within the ventral hernia and within the adjacent fat in the abdominal cavity. There is no bowel associated with this hernia. Negative for free fluid. Negative for free air. Musculoskeletal: No acute bone abnormality. IMPRESSION: 1. No evidence for active GI bleeding. Hyperemia noted along the left side of the lower rectum and anus but no evidence for active bleeding. 2. Periumbilical hernia containing fat. Mild fat stranding within the hernia and within the adjacent fat in the abdominal cavity. Correlate for any signs of incarceration. 3. Nonobstructive left renal stone. Electronically  Signed   By: Juliene Balder M.D.   On: 07/09/2023 13:23     Procedures   Medications Ordered in the ED  iohexol  (OMNIPAQUE ) 350 MG/ML injection 75 mL (75 mLs Intravenous Contrast Given 07/09/23 1246)                                    Medical Decision Making Amount and/or Complexity of Data Reviewed Labs: ordered. Radiology: ordered.  Risk Prescription drug management.   Medical Decision Making:   Niko Penson is a 48 y.o. male who presented to the ED today with bright red blood per rectum detailed above.     Complete initial physical exam performed, notably the patient  was alert and oriented in no apparent distress.  Physical exam as noted.  Notable finding of umbilical hernia, mild tenderness to palpation..    Reviewed and confirmed nursing documentation for past medical history, family history, social history.    Initial Assessment:   With the patient's presentation of blood per rectum, most likely diagnosis is diverticulitis.  Also considering differential diagnosis of complicated abdominal hernia, as well as internal hemorrhoids.   Initial Plan:  Perform DRE to assess for presence of stool and to evaluate for presence of hemorrhoids. Screening labs including CBC and Metabolic panel to evaluate for infectious or metabolic etiology of disease.  CT scan of the abdomen/pelvis as well as CT angio for GI bleed and also to assess abdominal hernia. Objective evaluation as below reviewed   Initial Study Results:   Laboratory  All laboratory results reviewed without evidence of clinically relevant pathology.   Exceptions include: Positive stool guaiac   Radiology:  All images reviewed independently. Agree with radiology report at this time.   CT ANGIO GI BLEED Result Date: 07/09/2023 CLINICAL DATA:  Lower GI bleed, positive guaiac. EXAM: CTA ABDOMEN AND PELVIS WITHOUT AND WITH CONTRAST TECHNIQUE: Multidetector CT imaging of the abdomen and pelvis was performed using the  standard protocol during bolus administration of intravenous contrast. Multiplanar reconstructed images and MIPs were obtained and reviewed to evaluate the vascular anatomy. RADIATION DOSE REDUCTION: This exam was performed according to the departmental dose-optimization program which includes automated exposure control, adjustment of the mA and/or kV according to patient size and/or use of iterative reconstruction technique. CONTRAST:  75mL OMNIPAQUE  IOHEXOL  350 MG/ML SOLN COMPARISON:  None Available. FINDINGS: VASCULAR Aorta: Normal caliber aorta without aneurysm, dissection, vasculitis or significant stenosis. Celiac: Patent without  evidence of aneurysm, dissection, vasculitis or significant stenosis. SMA: Patent without evidence of aneurysm, dissection, vasculitis or significant stenosis. Renals: Single right renal artery. Two left renal arteries. Renal arteries are patent without significant stenosis. No significant aneurysm or dissection involving the renal arteries. IMA: Patent without evidence of aneurysm, dissection, vasculitis or significant stenosis. Inflow: Patent without evidence of aneurysm, dissection, vasculitis or significant stenosis. Proximal Outflow: Proximal femoral arteries are patent bilaterally. Veins: Portal venous system is patent. No gross abnormality to the IVC. Mild compression on the left common iliac vein from the right common iliac artery and this is a normal variant. Review of the MIP images confirms the above findings. NON-VASCULAR Lower chest: Lung bases are clear. Hepatobiliary: Normal appearance of the liver and gallbladder. No biliary dilatation. Pancreas: Unremarkable. No pancreatic ductal dilatation or surrounding inflammatory changes. Spleen: Normal in size without focal abnormality. Adrenals/Urinary Tract: Normal adrenal glands. 4 mm stone in the left kidney interpolar region. No hydronephrosis. No suspicious renal lesion. Mild distention of the urinary bladder.  Stomach/Bowel: Small hiatal hernia. Normal appearance of the stomach. No bowel dilatation or obstruction. Normal appendix. Hyperemia noted along the left side of the lower rectum and anus but there is no evidence for active bleeding based on the venous phase imaging. No focal bowel inflammation. Lymphatic: No lymph node enlargement in the abdomen or pelvis. Reproductive: Prostate is unremarkable. Other: Right periumbilical hernia containing fat. The abdominal wall defect measures roughly 1.6 cm. Mild fat stranding within the ventral hernia and within the adjacent fat in the abdominal cavity. There is no bowel associated with this hernia. Negative for free fluid. Negative for free air. Musculoskeletal: No acute bone abnormality. IMPRESSION: 1. No evidence for active GI bleeding. Hyperemia noted along the left side of the lower rectum and anus but no evidence for active bleeding. 2. Periumbilical hernia containing fat. Mild fat stranding within the hernia and within the adjacent fat in the abdominal cavity. Correlate for any signs of incarceration. 3. Nonobstructive left renal stone. Electronically Signed   By: Juliene Balder M.D.   On: 07/09/2023 13:23     Reassessment and Plan:   After extensive workup along with physical exam evaluation, at this time we will discharge patient with outpatient follow-up to gastroenterology for rectal bleeding, CT scan did not demonstrate any acute diverticulitis or other source of GI bleed.  Did demonstrate hyperemia of the rectum, possibly related to cause of his bleeding.  Further have referred patient to general surgery for evaluation of umbilical hernia.  Careful return precautions been given to the patient, he voices understanding and agreement and has no further concerns at this time.       Final diagnoses:  Rectal bleeding  Umbilical hernia without obstruction and without gangrene    ED Discharge Orders     None          Myriam Dorn BROCKS, PA 07/09/23  1444    Elnor Savant A, DO 07/10/23 0719  "

## 2023-07-09 NOTE — ED Triage Notes (Signed)
 Spanish interpreter used for triage. Pt states for past 8-10 days, pt has had blood in stool when he has a bowel movement. Pt describes blood as bright red and dark. Pt states he also has a hernia in his abdomen that is hurting. Pt denies nausea or vomiting.

## 2023-07-09 NOTE — Discharge Instructions (Signed)
 Please contact the provided referrals to seek evaluation for your rectal bleeding with gastroenterology, and for your hernia with our general surgery team.  If you have any worsening of your pain, if you have new onset of nausea/vomiting, and you feel weak or dizzy, please return to the emergency room for further evaluation.

## 2023-07-19 ENCOUNTER — Ambulatory Visit (HOSPITAL_COMMUNITY): Admission: EM | Admit: 2023-07-19 | Discharge: 2023-07-19 | Disposition: A | Discharge status: Home / Self Care

## 2023-07-19 ENCOUNTER — Encounter (HOSPITAL_COMMUNITY): Payer: Self-pay

## 2023-07-19 DIAGNOSIS — K429 Umbilical hernia without obstruction or gangrene: Secondary | ICD-10-CM

## 2023-07-19 NOTE — ED Provider Notes (Signed)
 [facility]    CSN: [id number] Arrival date & time: 07/19/23  1712      History   Chief Complaint Chief Complaint  Patient presents with   Hernia    HPI Anthony Gutierrez is a 48 y.o. male.   Patient is a 48 year old male who presents to the urgent care today with concerns of an umbilical hernia.  He reports it has been present for about the past 8 or 9 months.  He has not had any evaluation for it and would like management for going forward.  He reports that occasionally, it feels painful and like it is pressing on something.  He has not taken any medicine to help with the pain.  He denies any fever, vomiting, inability to defecate, or other concerns at this time.    History reviewed. No pertinent past medical history.  Patient Active Problem List   Diagnosis Date Noted   Pain in right wrist 01/09/2016    History reviewed. No pertinent surgical history.     Home Medications    Prior to Admission medications   Medication Sig Start Date End Date Taking? Authorizing Provider  benzonatate  (TESSALON ) 100 MG capsule Take 1 capsule (100 mg total) by mouth every 8 (eight) hours. 04/05/16   Leann Satterfield, NP  diclofenac  (VOLTAREN ) 75 MG EC tablet Take 1 tablet (75 mg total) by mouth 2 (two) times daily. 04/05/16   Leann Satterfield, NP  ibuprofen  (ADVIL ,MOTRIN ) 800 MG tablet Take 800 mg by mouth every 8 (eight) hours as needed.    [provider]  ofloxacin  (OCUFLOX ) 0.3 % ophthalmic solution 1 drop every 4 hours for 2 days, then 4 times a day for 5 days. 02/18/17   Babara, Amy V, PA-C  ondansetron  (ZOFRAN  ODT) 4 MG disintegrating tablet Take 1 tablet (4 mg total) by mouth every 8 (eight) hours as needed for nausea or vomiting. 04/05/16   Leann Satterfield, NP  oseltamivir  (TAMIFLU ) 75 MG capsule Take 1 capsule (75 mg total) by mouth every 12 (twelve) hours. 02/15/17   Windle Almarie ORN, PA-C  Polyethyl Glycol-Propyl Glycol (SYSTANE) 0.4-0.3 % GEL ophthalmic gel  Place 1 application into both eyes at bedtime as needed. 02/18/17   Babara Greig GAILS, PA-C    Family History History reviewed. No pertinent family history.  Social History Social History   Tobacco Use   Smoking status: Every Day    Current packs/day: 0.10    Types: Cigarettes   Smokeless tobacco: Never  Vaping Use   Vaping status: Never Used  Substance Use Topics   Alcohol use: Yes    Comment: occasional   Drug use: No     Allergies   Patient has no known allergies.   Review of Systems Review of Systems See HPI for all other ROS.  Physical Exam Triage Vital Signs ED Triage Vitals  Encounter Vitals Group     BP 07/19/23 1722 103/68     Girls Systolic BP Percentile --      Girls Diastolic BP Percentile --      Boys Systolic BP Percentile --      Boys Diastolic BP Percentile --      Pulse Rate 07/19/23 1722 69     Resp 07/19/23 1722 16     Temp 07/19/23 1722 98.3 F (36.8 C)     Temp Source 07/19/23 1722 Oral     SpO2 07/19/23 1722 95 %     Weight --      Height --  Head Circumference --      Peak Flow --      Pain Score 07/19/23 1726 6     Pain Loc --      Pain Education --      Exclude from Growth Chart --    No data found.  Updated Vital Signs BP 103/68 (BP Location: Left Arm)   Pulse 69   Temp 98.3 F (36.8 C) (Oral)   Resp 16   SpO2 95%   Visual Acuity Right Eye Distance:   Left Eye Distance:   Bilateral Distance:    Right Eye Near:   Left Eye Near:    Bilateral Near:     Physical Exam General: Alert and oriented, well-developed/well-nourished, calm, cooperative, no acute distress HEENT: Normocephalic atraumatic, moist mucous membranes, no scleral icterus, trachea midline Lungs: Speaking full sentences, non-labored respirations, no distress Heart: Regular rate and rhythm Abdomen:  Soft, umbilical hernia present that is reducible and mildly tender to palpation Musculoskeletal: Moves all extremities well Neurologic: Awake, A&O x4, gait  normal Integumentary: Warm, dry, normal for ethnicity, intact, no rash Psychiatric: Appropriate mood & affect  UC Treatments / Results  Labs (all labs ordered are listed, but only abnormal results are displayed) Labs Reviewed - No data to display  EKG   Radiology No results found.  Procedures Procedures (including critical care time)  Medications Ordered in UC Medications - No data to display  Initial Impression / Assessment and Plan / UC Course  I have reviewed the triage vital signs and the nursing notes.  Pertinent labs & imaging results that were available during my care of the patient were reviewed by me and considered in my medical decision making (see chart for details).    Presents with concern of abdominal hernia.  Differential Diagnosis: Umbilical hernia, strangulated hernia, incarcerated hernia, abscess, cellulitis, intra-abdominal etiology, including other diagnoses.  Rationale: Patient presents with symptoms consistent with an umbilical hernia.  Hernia does not appear to be strangulated or incarcerated at this time.  Patient has been dealing with the symptoms for about the past 8 or 9 months.  He denies any concerning symptoms including pain out of proportion, difficulty with defecation, vomiting, or other concerns.  Provided patient with information to follow-up with general surgery.  Recommended patient follow-up with his primary care provider for further evaluation.  Discussed return precautions to the emergency department including severe abdominal pain, fever, vomiting, worsening of symptoms, difficulty with defecation, or if he has any other concerns.   Disposition: Stable to discharge home.  All questions answered to the best of this examiner's ability. Reviewed possible severe sequelae and other reasons to return to urgent care or ED for further evaluation and/or treatment. Advised to f/u PCP w/in 48 to 72 hours for further eval and/or reassessment as needed.  Patient voices understanding of the above and agrees to plan.  An appropriate evaluation has been performed, and in my medical judgment there is currently no evidence of an immediate life-threatening or surgical condition. Discharge is therefore indicated at this time.  This document was created using the aid of voice recognition Scientist, clinical (histocompatibility and immunogenetics).  Final Clinical Impressions(s) / UC Diagnoses   Final diagnoses:  Umbilical hernia without obstruction and without gangrene     Discharge Instructions      We have provided information for you to follow-up with general surgery for the hernia.  Please call them and schedule an appointment.  Please go to the emergency department if you develop  any fever, severe abdominal pain, are unable to have a bowel movement, begin vomiting, or if you have any other concerns.  Traducido por Google:  Le proporcionamos informacin para que pueda realizar un seguimiento con ciruga general para la hernia. Llmelos y programe una cita. Acuda a urgencias si presenta fiebre, dolor abdominal intenso, dificultad para defecar, vmitos o cualquier otra inquietud.     ED Prescriptions   None    PDMP not reviewed this encounter.   Melonie Locus, PA-C 07/19/23 1845

## 2023-07-19 NOTE — Discharge Instructions (Signed)
 We have provided information for you to follow-up with general surgery for the hernia.  Please call them and schedule an appointment.  Please go to the emergency department if you develop any fever, severe abdominal pain, are unable to have a bowel movement, begin vomiting, or if you have any other concerns.  Traducido por Google:  Le proporcionamos informacin para que pueda realizar un seguimiento con ciruga general para la hernia. Llmelos y programe una cita. Acuda a urgencias si presenta fiebre, dolor abdominal intenso, dificultad para defecar, vmitos o cualquier otra inquietud.

## 2023-07-19 NOTE — ED Triage Notes (Signed)
 Spanish interpreter used for triage,  Patient states knot In the center of his stomach x 6 months. Patient states he has a hernia and would like to see how he can get it remove.

## 2023-08-26 ENCOUNTER — Encounter (HOSPITAL_COMMUNITY): Payer: Self-pay

## 2023-10-07 ENCOUNTER — Ambulatory Visit: Admitting: Nurse Practitioner

## 2023-10-07 ENCOUNTER — Encounter: Payer: Self-pay | Admitting: Nurse Practitioner

## 2023-10-07 VITALS — BP 122/68 | HR 79 | Temp 97.5°F | Ht 64.0 in | Wt 187.4 lb

## 2023-10-07 DIAGNOSIS — Z800 Family history of malignant neoplasm of digestive organs: Secondary | ICD-10-CM

## 2023-10-07 DIAGNOSIS — K429 Umbilical hernia without obstruction or gangrene: Secondary | ICD-10-CM | POA: Diagnosis not present

## 2023-10-07 DIAGNOSIS — E785 Hyperlipidemia, unspecified: Secondary | ICD-10-CM

## 2023-10-07 DIAGNOSIS — Z1211 Encounter for screening for malignant neoplasm of colon: Secondary | ICD-10-CM

## 2023-10-07 NOTE — Assessment & Plan Note (Signed)
 Father has history of colon cancer at age 47. Referral placed to GI for colonoscopy.

## 2023-10-07 NOTE — Assessment & Plan Note (Signed)
 The hernia is increasing in size and causing pain. Refer to a surgeon for evaluation and potential surgery. Advise a hospital visit if the hernia worsens

## 2023-10-07 NOTE — Assessment & Plan Note (Signed)
 Chronic, ongoing. Will check labs next visit.

## 2023-10-07 NOTE — Patient Instructions (Signed)
 It was great to see you!  I have placed a referral to the stomach doctor and surgeon   Let's follow-up in 3 months, sooner if you have concerns.  If a referral was placed today, you will be contacted for an appointment. Please note that routine referrals can sometimes take up to 3-4 weeks to process. Please call our office if you haven't heard anything after this time frame.  Take care,  Tinnie Harada, NP

## 2023-10-07 NOTE — Progress Notes (Signed)
 New Patient Visit  BP 122/68 (BP Location: Right Arm, Patient Position: Sitting, Cuff Size: Normal)   Pulse 79   Temp (!) 97.5 F (36.4 C)   Ht 5' 4 (1.626 m)   Wt 187 lb 6.4 oz (85 kg)   SpO2 95%   BMI 32.17 kg/m    Subjective:    Patient ID: Anthony Gutierrez, male    DOB: [birth year], 48 y.o.   MRN: [id number]  CC: Chief Complaint  Patient presents with   Establish Care    NP. Est. Care, concerns with having a lump on navel for 13 years    HPI: Anthony Gutierrez is a 48 y.o. male presents for new patient visit to establish care.  Introduced to Publishing rights manager role and practice setting.  All questions answered.  Discussed provider/patient relationship and expectations.  Discussed the use of AI scribe software for clinical note transcription with the patient, who gave verbal consent to proceed.  History of Present Illness   Anthony Gutierrez is a 48 year old male who presents to establish care and concerns with an enlarging abdominal lump.  He has had the abdominal lump for two and a half years, which has increased in size and changed color. The lump causes pain, particularly with lifting or exertion, and he experiences a 'poking' sensation when sitting and pain when standing.  In the past three to four weeks, he has experienced constipation followed by diarrhea. Two to three months ago, he had significant rectal bleeding and was evaluated at [facility], where studies reportedly showed no abnormalities. He currently has no rectal bleeding and monitors his stool daily.  He denies chest pain, shortness of breath, nausea, or skin rashes. He has high cholesterol but no other significant medical history and has not had any surgeries.  His family history includes his mother with diabetes and his father, who died from colon cancer at 31. He smokes six to seven cigarettes daily since age 52. 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     Depression and Anxiety Screen done:     10/07/2023    1:56 PM  Depression screen PHQ 2/9  Decreased Interest 0  Down, Depressed, Hopeless 0  PHQ - 2 Score 0  Altered sleeping 0  Tired, decreased energy 0  Change in appetite 0  Feeling bad or failure about yourself  0  Trouble concentrating 0  Moving slowly or fidgety/restless 0  Suicidal thoughts 0  PHQ-9 Score 0  Difficult doing work/chores Not difficult at all      10/07/2023    1:58 PM  GAD 7 : Generalized Anxiety Score  Nervous, Anxious, on Edge 0  Control/stop worrying 0  Worry too much - different things 0  Trouble relaxing 0  Restless 0  Easily annoyed or irritable 0  Afraid - awful might happen 0  Total GAD 7 Score 0  Anxiety Difficulty Not difficult at all     Past Medical History:  Diagnosis Date   Hyperlipidemia    Umbilical hernia     History reviewed. No pertinent surgical history.  Family History  Problem Relation Age of Onset   Diabetes Mother    Cancer Father 52       colon     Social History   Tobacco Use   Smoking status: Every Day    Current packs/day: 0.25    Average packs/day: 0.3 packs/day for 31.0 years (7.8  ttl pk-yrs)    Types: Cigarettes    Start date: 10/06/1992   Smokeless tobacco: Never  Vaping Use   Vaping status: Never Used  Substance Use Topics   Alcohol use: Yes    Comment: occasional   Drug use: No    No current outpatient medications on file prior to visit.   No current facility-administered medications on file prior to visit.     Review of Systems  Constitutional: Negative.   HENT: Negative.    Eyes: Negative.   Respiratory: Negative.    Cardiovascular: Negative.   Gastrointestinal:  Negative for blood in stool, constipation, diarrhea and nausea.       Umbilical hernia  Endocrine: Negative.   Genitourinary: Negative.   Musculoskeletal: Negative.   Skin: Negative.   Neurological: Negative.   Psychiatric/Behavioral: Negative.           Objective:    BP 122/68 (BP Location: Right Arm, Patient Position: Sitting, Cuff Size: Normal)   Pulse 79   Temp (!) 97.5 F (36.4 C)   Ht 5' 4 (1.626 m)   Wt 187 lb 6.4 oz (85 kg)   SpO2 95%   BMI 32.17 kg/m   Wt Readings from Last 3 Encounters:  10/07/23 187 lb 6.4 oz (85 kg)  07/09/23 187 lb (84.8 kg)  02/14/17 185 lb (83.9 kg)    BP Readings from Last 3 Encounters:  10/07/23 122/68  07/19/23 103/68  07/09/23 118/79    Physical Exam Vitals and nursing note reviewed.  Constitutional:      General: He is not in acute distress.    Appearance: Normal appearance.  HENT:     Head: Normocephalic and atraumatic.     Right Ear: Tympanic membrane, ear canal and external ear normal.     Left Ear: Tympanic membrane, ear canal and external ear normal.  Eyes:     Conjunctiva/sclera: Conjunctivae normal.  Cardiovascular:     Rate and Rhythm: Normal rate and regular rhythm.     Pulses: Normal pulses.     Heart sounds: Normal heart sounds.  Pulmonary:     Effort: Pulmonary effort is normal.     Breath sounds: Normal breath sounds.  Abdominal:     General: There is no distension.     Palpations: Abdomen is soft.     Tenderness: There is abdominal tenderness. There is no guarding.     Hernia: A hernia (umbilical) is present.  Musculoskeletal:        General: Normal range of motion.     Cervical back: Normal range of motion and neck supple. No tenderness.     Right lower leg: No edema.     Left lower leg: No edema.  Lymphadenopathy:     Cervical: No cervical adenopathy.  Skin:    General: Skin is warm and dry.  Neurological:     General: No focal deficit present.     Mental Status: He is alert and oriented to person, place, and time.     Cranial Nerves: No cranial nerve deficit.     Coordination: Coordination normal.     Gait: Gait normal.  Psychiatric:        Mood and Affect: Mood normal.        Behavior: Behavior normal.        Thought Content: Thought content  normal.        Judgment: Judgment normal.        Assessment & Plan:   Problem List Items  Addressed This Visit       Other   Umbilical hernia without obstruction and without gangrene - Primary   The hernia is increasing in size and causing pain. Refer to a surgeon for evaluation and potential surgery. Advise a hospital visit if the hernia worsens       Relevant Orders   Ambulatory referral to General Surgery   Family history of colon cancer   Father has history of colon cancer at age 55. Referral placed to GI for colonoscopy.       Relevant Orders   Ambulatory referral to Gastroenterology   Hyperlipidemia   Chronic, ongoing. Will check labs next visit.       Other Visit Diagnoses       Screen for colon cancer       Referral to GI for colonoscopy with family history of colon cancer in father at 43 and recent blood in stool   Relevant Orders   Ambulatory referral to Gastroenterology        Follow up plan: Return in about 3 months (around 01/06/2024) for CPE.  Ashna Dorough A Gina Leblond

## 2023-10-22 ENCOUNTER — Emergency Department (HOSPITAL_COMMUNITY)
Admission: EM | Admit: 2023-10-22 | Discharge: 2023-10-23 | Discharge status: Against Medical Advice | Attending: Emergency Medicine | Admitting: Emergency Medicine

## 2023-10-22 ENCOUNTER — Other Ambulatory Visit: Payer: Self-pay

## 2023-10-22 ENCOUNTER — Emergency Department (HOSPITAL_COMMUNITY)

## 2023-10-22 ENCOUNTER — Encounter (HOSPITAL_COMMUNITY): Payer: Self-pay | Admitting: *Deleted

## 2023-10-22 DIAGNOSIS — R42 Dizziness and giddiness: Secondary | ICD-10-CM | POA: Diagnosis present

## 2023-10-22 DIAGNOSIS — R61 Generalized hyperhidrosis: Secondary | ICD-10-CM | POA: Insufficient documentation

## 2023-10-22 DIAGNOSIS — Z5321 Procedure and treatment not carried out due to patient leaving prior to being seen by health care provider: Secondary | ICD-10-CM | POA: Insufficient documentation

## 2023-10-22 LAB — URINALYSIS, ROUTINE W REFLEX MICROSCOPIC
Bacteria, UA: NONE SEEN
Bilirubin Urine: NEGATIVE
Glucose, UA: NEGATIVE mg/dL
Ketones, ur: NEGATIVE mg/dL
Leukocytes,Ua: NEGATIVE
Nitrite: NEGATIVE
Protein, ur: NEGATIVE mg/dL
Specific Gravity, Urine: 1.006 (ref 1.005–1.030)
pH: 6 (ref 5.0–8.0)

## 2023-10-22 LAB — COMPREHENSIVE METABOLIC PANEL WITH GFR
ALT: 25 U/L (ref 0–44)
AST: 25 U/L (ref 15–41)
Albumin: 4 g/dL (ref 3.5–5.0)
Alkaline Phosphatase: 44 U/L (ref 38–126)
Anion gap: 11 (ref 5–15)
BUN: 15 mg/dL (ref 6–20)
CO2: 21 mmol/L — ABNORMAL LOW (ref 22–32)
Calcium: 9.1 mg/dL (ref 8.9–10.3)
Chloride: 104 mmol/L (ref 98–111)
Creatinine, Ser: 0.68 mg/dL (ref 0.61–1.24)
GFR, Estimated: >60 mL/min (ref >60)
Glucose, Bld: 92 mg/dL (ref 70–99)
Potassium: 3.8 mmol/L (ref 3.5–5.1)
Sodium: 136 mmol/L (ref 135–145)
Total Bilirubin: 0.5 mg/dL (ref 0.0–1.2)
Total Protein: 7 g/dL (ref 6.5–8.1)

## 2023-10-22 LAB — CBC
HCT: 44.5 % (ref 39.0–52.0)
Hemoglobin: 15 g/dL (ref 13.0–17.0)
MCH: 32.4 pg (ref 26.0–34.0)
MCHC: 33.7 g/dL (ref 30.0–36.0)
MCV: 96.1 fL (ref 80.0–100.0)
Platelets: 268 K/uL (ref 150–400)
RBC: 4.63 MIL/uL (ref 4.22–5.81)
RDW: 12.2 % (ref 11.5–15.5)
WBC: 7.3 K/uL (ref 4.0–10.5)
nRBC: 0 % (ref 0.0–0.2)

## 2023-10-22 LAB — CBG MONITORING, ED: Glucose-Capillary: 92 mg/dL (ref 70–99)

## 2023-10-22 NOTE — ED Triage Notes (Addendum)
 Pt c/o episode of diaphoresis while standing outside today at 1600.  He walked into the building and became dizzy.  His colleagues laid him down and placed ice packs on him and elevated his feet, which improved his symptoms.  After he sat up, the dizziness returned.  Pt states preceding this episode, pt was upset with one of his colleagues and the pt reported the incident to his advisor.  Pt having difficulty ambulating in triage. NIH negative.  Denies headache.

## 2023-10-23 ENCOUNTER — Ambulatory Visit (HOSPITAL_COMMUNITY)
Admission: EM | Admit: 2023-10-23 | Discharge: 2023-10-23 | Disposition: A | Discharge status: Home / Self Care | Attending: Emergency Medicine | Admitting: Emergency Medicine

## 2023-10-23 ENCOUNTER — Encounter (HOSPITAL_COMMUNITY): Payer: Self-pay

## 2023-10-23 DIAGNOSIS — R42 Dizziness and giddiness: Secondary | ICD-10-CM | POA: Diagnosis not present

## 2023-10-23 LAB — POCT FASTING CBG KUC MANUAL ENTRY: POCT Glucose (KUC): 115 mg/dL — AB (ref 70–99)

## 2023-10-23 NOTE — Discharge Instructions (Signed)
 Como ya comentamos, su examen de hoy es en general tranquilizador. He revisado los [place] de laboratorio y las imgenes de su visita a urgencias de Theme park manager y Risk manager se han encontrado hallazgos anormales. Le recomiendo que consulte con su mdico de cabecera para una evaluacin y Armed forces training and education officer. Si vuelve a presentar mareos y, adems, presenta dolor de cabeza intenso o vmitos, confusin, dificultad para hablar, debilidad, entumecimiento, hormigueo o cada facial, busque atencin mdica inmediata en urgencias para una evaluacin ms exhaustiva.  As discussed your exam is overall reassuring today.  I have reviewed your lab results and imaging from your ER visit yesterday and there are no abnormal findings from those as well. I recommend following up with your primary care provider for further evaluation and management. If you develop dizziness again and also have severe headache or vomiting, confusion, slurred speech, weakness, numbness, tingling, or facial droop with this please seek immediate medical treatment in the emergency department for further evaluation.

## 2023-10-23 NOTE — ED Triage Notes (Signed)
 Interpreter: Wilder [phone]  Patient states yesterday went to [facility] but left due to the wait. Onset yesterday afternoon Patient was dizzy and felt drunk but had not had anything to drink. Denies any vision changes.   States this happened about a year ago, states he was at work and was brought by ambulance to [facility]. Was told it was because of his bp but unsure if it was high or low.

## 2023-10-23 NOTE — ED Provider Notes (Signed)
 [facility]    CSN: [id number] Arrival date & time: 10/23/23  1039      History   Chief Complaint Chief Complaint  Patient presents with   Dizziness    HPI Anthony Gutierrez is a 48 y.o. male.   Patient presents with episode of dizziness that occurred yesterday that began around 4 PM and lasted until about 6:30 PM.  Patient states that he was so dizzy that he felt a little off balance and almost felt as if he was drunk.  Patient states that the dizziness subsided and has not returned since.  Patient denies any headache, nausea, vomiting, confusion, slurred speech, facial droop, weakness, numbness, tingling, chest pain, or shortness of breath.  Patient also denies any recent head injuries or falls.  Patient did go to the emergency department yesterday when this was happening to be evaluated.  Patient states they did lab work and imaging of his head, but he left before receiving these results due to wait time.  Patient denies any symptoms today.  Patient denies any history of high blood pressure, but was concerned that this was related to his blood pressure.  Patient does have a history of hyperlipidemia, but does not take any medication for this.  The history is provided by the patient and medical records. The history is limited by a language barrier. A language interpreter was used (Spanish interpreter).  Dizziness   Past Medical History:  Diagnosis Date   Hyperlipidemia    Umbilical hernia     Patient Active Problem List   Diagnosis Date Noted   Umbilical hernia without obstruction and without gangrene 10/07/2023   Family history of colon cancer 10/07/2023   Hyperlipidemia 10/07/2023   Pain in right wrist 01/09/2016    History reviewed. No pertinent surgical history.     Home Medications    Prior to Admission medications   Not on File    Family History Family History  Problem Relation Age of Onset   Diabetes Mother    Cancer Father 1       colon     Social History Social History   Tobacco Use   Smoking status: Every Day    Current packs/day: 0.25    Average packs/day: 0.3 packs/day for 31.0 years (7.8 ttl pk-yrs)    Types: Cigarettes    Start date: 10/06/1992   Smokeless tobacco: Never  Vaping Use   Vaping status: Never Used  Substance Use Topics   Alcohol use: Yes    Comment: occasional   Drug use: No     Allergies   Pollen extract   Review of Systems Review of Systems  Neurological:  Positive for dizziness.   Per HPI  Physical Exam Triage Vital Signs ED Triage Vitals  Encounter Vitals Group     BP 10/23/23 1205 121/77     Girls Systolic BP Percentile --      Girls Diastolic BP Percentile --      Boys Systolic BP Percentile --      Boys Diastolic BP Percentile --      Pulse Rate 10/23/23 1205 64     Resp 10/23/23 1205 18     Temp 10/23/23 1205 98.1 F (36.7 C)     Temp Source 10/23/23 1205 Oral     SpO2 10/23/23 1205 91 %     Weight --      Height 10/23/23 1205 5' 4 (1.626 m)     Head Circumference --  Peak Flow --      Pain Score 10/23/23 1203 0     Pain Loc --      Pain Education --      Exclude from Growth Chart --    No data found.  Updated Vital Signs BP 121/77 (BP Location: Right Arm)   Pulse 64   Temp 98.1 F (36.7 C) (Oral)   Resp 18   Ht 5' 4 (1.626 m)   SpO2 91%   BMI 32.17 kg/m   Visual Acuity Right Eye Distance:   Left Eye Distance:   Bilateral Distance:    Right Eye Near:   Left Eye Near:    Bilateral Near:     Physical Exam Vitals and nursing note reviewed.  Constitutional:      General: He is awake. He is not in acute distress.    Appearance: Normal appearance. He is well-developed and well-groomed. He is not ill-appearing.  HENT:     Right Ear: Tympanic membrane, ear canal and external ear normal.     Left Ear: Tympanic membrane, ear canal and external ear normal.  Cardiovascular:     Rate and Rhythm: Normal rate and regular rhythm.  Pulmonary:      Effort: Pulmonary effort is normal.     Breath sounds: Normal breath sounds.  Musculoskeletal:        General: Normal range of motion.  Skin:    General: Skin is warm and dry.  Neurological:     General: No focal deficit present.     Mental Status: He is alert and oriented to person, place, and time. Mental status is at baseline.     GCS: GCS eye subscore is 4. GCS verbal subscore is 5. GCS motor subscore is 6.     Cranial Nerves: Cranial nerves 2-12 are intact.     Sensory: Sensation is intact.     Motor: Motor function is intact.     Coordination: Coordination is intact.     Gait: Gait is intact.  Psychiatric:        Mood and Affect: Mood normal.        Behavior: Behavior normal. Behavior is cooperative.        Thought Content: Thought content normal.        Judgment: Judgment normal.      UC Treatments / Results  Labs (all labs ordered are listed, but only abnormal results are displayed) Labs Reviewed  POCT FASTING CBG [facility] MANUAL ENTRY - Abnormal; Notable for the following components:      Result Value   POCT Glucose ([facility]) 115 (*)    All other components within normal limits    EKG   Radiology CT HEAD WO CONTRAST Result Date: 10/22/2023 CLINICAL DATA:  Vertigo. EXAM: CT HEAD WITHOUT CONTRAST TECHNIQUE: Contiguous axial images were obtained from the base of the skull through the vertex without intravenous contrast. RADIATION DOSE REDUCTION: This exam was performed according to the departmental dose-optimization program which includes automated exposure control, adjustment of the mA and/or kV according to patient size and/or use of iterative reconstruction technique. COMPARISON:  None Available. FINDINGS: Brain: No evidence of acute infarction, hemorrhage, hydrocephalus, extra-axial collection or mass lesion/mass effect. Vascular: No hyperdense vessel or unexpected calcification. Skull: Normal. Negative for fracture or focal lesion. Sinuses/Orbits: No acute finding. Other:  None. IMPRESSION: No acute intracranial pathology. Electronically Signed   By: Suzen Dials M.D.   On: 10/22/2023 21:11    Procedures Procedures (including critical care  time)  Medications Ordered in UC Medications - No data to display  Initial Impression / Assessment and Plan / UC Course  I have reviewed the triage vital signs and the nursing notes.  Pertinent labs & imaging results that were available during my care of the patient were reviewed by me and considered in my medical decision making (see chart for details).     Patient is overall well-appearing.  Vitals are stable.  Exam is overall reassuring.  No neurodeficits noted and CBG within normal limits.  EKG is unchanged from yesterday and does not reveal any acute cardiac findings.  It does reveal an incomplete right bundle branch block.  Recommend following with primary care for further evaluation.  Discussed follow-up, return, and strict ER precautions. Final Clinical Impressions(s) / UC Diagnoses   Final diagnoses:  Dizziness     Discharge Instructions      Como ya comentamos, su examen de hoy es en general tranquilizador. He revisado los [place] de laboratorio y las imgenes de su visita a urgencias de Theme park manager y Risk manager se han encontrado hallazgos anormales. Le recomiendo que consulte con su mdico de cabecera para una evaluacin y Armed forces training and education officer. Si vuelve a presentar mareos y, adems, presenta dolor de cabeza intenso o vmitos, confusin, dificultad para hablar, debilidad, entumecimiento, hormigueo o cada facial, busque atencin mdica inmediata en urgencias para una evaluacin ms exhaustiva.  As discussed your exam is overall reassuring today.  I have reviewed your lab results and imaging from your ER visit yesterday and there are no abnormal findings from those as well. I recommend following up with your primary care provider for further evaluation and management. If you develop dizziness again and  also have severe headache or vomiting, confusion, slurred speech, weakness, numbness, tingling, or facial droop with this please seek immediate medical treatment in the emergency department for further evaluation.   ED Prescriptions   None    PDMP not reviewed this encounter.   Johnie Flaming A, NP 10/23/23 1354

## 2023-10-23 NOTE — ED Notes (Signed)
Pt stated that he was leaving.  

## 2023-10-25 ENCOUNTER — Telehealth: Payer: Self-pay | Admitting: Nurse Practitioner

## 2023-10-25 NOTE — Telephone Encounter (Signed)
 [facility] called stating they only have 2 modifiers they have no birthdate or address. Please update so they can complete this referral.  ABDALLAH, HATEM General Surgery 10/25/2023

## 2024-01-06 ENCOUNTER — Encounter: Payer: Self-pay | Admitting: Nurse Practitioner

## 2024-01-06 ENCOUNTER — Ambulatory Visit (INDEPENDENT_AMBULATORY_CARE_PROVIDER_SITE_OTHER): Admitting: Nurse Practitioner

## 2024-01-06 VITALS — BP 134/82 | HR 80 | Temp 98.0°F | Ht 64.0 in | Wt 190.4 lb

## 2024-01-06 DIAGNOSIS — Z1211 Encounter for screening for malignant neoplasm of colon: Secondary | ICD-10-CM

## 2024-01-06 DIAGNOSIS — Z0000 Encounter for general adult medical examination without abnormal findings: Secondary | ICD-10-CM | POA: Diagnosis not present

## 2024-01-06 DIAGNOSIS — Z114 Encounter for screening for human immunodeficiency virus [HIV]: Secondary | ICD-10-CM | POA: Diagnosis not present

## 2024-01-06 DIAGNOSIS — E785 Hyperlipidemia, unspecified: Secondary | ICD-10-CM | POA: Diagnosis not present

## 2024-01-06 DIAGNOSIS — R7301 Impaired fasting glucose: Secondary | ICD-10-CM | POA: Diagnosis not present

## 2024-01-06 DIAGNOSIS — N529 Male erectile dysfunction, unspecified: Secondary | ICD-10-CM | POA: Insufficient documentation

## 2024-01-06 DIAGNOSIS — K429 Umbilical hernia without obstruction or gangrene: Secondary | ICD-10-CM | POA: Diagnosis not present

## 2024-01-06 LAB — LIPID PANEL
Cholesterol: 205 mg/dL — ABNORMAL HIGH (ref 28–200)
HDL: 37.5 mg/dL — ABNORMAL LOW
LDL Cholesterol: 141 mg/dL — ABNORMAL HIGH (ref 10–99)
NonHDL: 167.69
Total CHOL/HDL Ratio: 5
Triglycerides: 134 mg/dL (ref 10.0–149.0)
VLDL: 26.8 mg/dL (ref 0.0–40.0)

## 2024-01-06 LAB — COMPREHENSIVE METABOLIC PANEL WITH GFR
ALT: 51 U/L (ref 3–53)
AST: 42 U/L — ABNORMAL HIGH (ref 5–37)
Albumin: 4.3 g/dL (ref 3.5–5.2)
Alkaline Phosphatase: 53 U/L (ref 39–117)
BUN: 13 mg/dL (ref 6–23)
CO2: 25 meq/L (ref 19–32)
Calcium: 9 mg/dL (ref 8.4–10.5)
Chloride: 103 meq/L (ref 96–112)
Creatinine, Ser: 0.61 mg/dL (ref 0.40–1.50)
GFR: 114.51 mL/min
Glucose, Bld: 111 mg/dL — ABNORMAL HIGH (ref 70–99)
Potassium: 4 meq/L (ref 3.5–5.1)
Sodium: 136 meq/L (ref 135–145)
Total Bilirubin: 0.4 mg/dL (ref 0.2–1.2)
Total Protein: 7.2 g/dL (ref 6.0–8.3)

## 2024-01-06 LAB — CBC WITH DIFFERENTIAL/PLATELET
Basophils Absolute: 0 K/uL (ref 0.0–0.1)
Basophils Relative: 0.6 % (ref 0.0–3.0)
Eosinophils Absolute: 0.1 K/uL (ref 0.0–0.7)
Eosinophils Relative: 2.1 % (ref 0.0–5.0)
HCT: 43.1 % (ref 39.0–52.0)
Hemoglobin: 14.7 g/dL (ref 13.0–17.0)
Lymphocytes Relative: 26.7 % (ref 12.0–46.0)
Lymphs Abs: 1.9 K/uL (ref 0.7–4.0)
MCHC: 34.2 g/dL (ref 30.0–36.0)
MCV: 96.1 fl (ref 78.0–100.0)
Monocytes Absolute: 0.6 K/uL (ref 0.1–1.0)
Monocytes Relative: 8.4 % (ref 3.0–12.0)
Neutro Abs: 4.3 K/uL (ref 1.4–7.7)
Neutrophils Relative %: 62.2 % (ref 43.0–77.0)
Platelets: 269 K/uL (ref 150.0–400.0)
RBC: 4.49 Mil/uL (ref 4.22–5.81)
RDW: 12.5 % (ref 11.5–15.5)
WBC: 6.9 K/uL (ref 4.0–10.5)

## 2024-01-06 MED ORDER — SILDENAFIL CITRATE 100 MG PO TABS: 50.0000 mg | ORAL_TABLET | Freq: Every day | ORAL | 11 refills | Status: AC | PRN

## 2024-01-06 NOTE — Assessment & Plan Note (Signed)
 Health maintenance reviewed and updated. Discussed nutrition, exercise. Follow-up 1 year.

## 2024-01-06 NOTE — Assessment & Plan Note (Signed)
 He experiences intermittent pain and burning, worsened by straining. Discussed ER precautions. Schedule an appointment with a surgeon for evaluation. Can start miralax daily as needed for constipation.

## 2024-01-06 NOTE — Assessment & Plan Note (Signed)
 He has had consistent difficulty achieving and maintaining an erection for three months, possibly due to stress and anxiety. Prescribe Viagra , take half to one tablet 30 minutes to one hour before intercourse. Recommend stress management through regular exercise, journaling, and discussing feelings with his spouse. Suggest therapy for stress management.

## 2024-01-06 NOTE — Progress Notes (Signed)
 "  BP 134/82 (BP Location: Left Arm, Patient Position: Sitting, Cuff Size: Normal)   Pulse 80   Temp 98 F (36.7 C)   Ht 5' 4 (1.626 m)   Wt 190 lb 6.4 oz (86.4 kg)   SpO2 97%   BMI 32.68 kg/m    Subjective:    Patient ID: Anthony Gutierrez, male    DOB: 1975-09-20, 48 y.o.   MRN: [id number]  CC: Chief Complaint  Patient presents with   Annual Exam    With non-fasting labs, concerns with hernia and having pain   Visit completed with video visit   HPI: Anthony Gutierrez is a 49 y.o. male presenting on 01/06/2024 for comprehensive medical examination. Current medical complaints include:hernia, erectile dysfunction  Discussed the use of AI scribe software for clinical note transcription with the patient, who gave verbal consent to proceed.  He has intermittent pain and burning at the hernia site, with concern about discoloration. Pain is worse with bowel movements and straining, occurring about five to six times per month. He has not seen a surgeon despite a prior referral because his 12-hour shifts and phone restrictions at work limit his ability to schedule. He does not use stool softeners or laxatives and relies on high water intake.  He has had erectile dysfunction for about two to three months, with difficulty achieving an erection sufficient for intercourse. This is causing tension with his wife. He feels stress and anxiety about his children and other life issues and believes this contributes to the problem.      He currently lives with: wife, children  Depression and Anxiety Screen done today and results listed below:     01/06/2024    9:50 AM 10/07/2023    1:56 PM  Depression screen PHQ 2/9  Decreased Interest 1 0  Down, Depressed, Hopeless 0 0  PHQ - 2 Score 1 0  Altered sleeping 0 0  Tired, decreased energy 1 0  Change in appetite 0 0  Feeling bad or failure about yourself  0 0  Trouble concentrating 0 0  Moving slowly or fidgety/restless 0 0  Suicidal thoughts 0 0   PHQ-9 Score 2 0   Difficult doing work/chores Not difficult at all Not difficult at all     Data saved with a previous flowsheet row definition      01/06/2024    9:50 AM 10/07/2023    1:58 PM  GAD 7 : Generalized Anxiety Score  Nervous, Anxious, on Edge 0 0  Control/stop worrying 0 0  Worry too much - different things 1 0  Trouble relaxing 0 0  Restless 0 0  Easily annoyed or irritable 0 0  Afraid - awful might happen 0 0  Total GAD 7 Score 1 0  Anxiety Difficulty Not difficult at all Not difficult at all    The patient does not have a history of falls. I did not complete a risk assessment for falls. A plan of care for falls was not documented.   Past Medical History:  Past Medical History:  Diagnosis Date   Hyperlipidemia    Umbilical hernia     Surgical History:  History reviewed. No pertinent surgical history.  Medications:  No current outpatient medications on file prior to visit.   No current facility-administered medications on file prior to visit.    Allergies:  Allergies[1]  Social History:  Social History   Socioeconomic History   Marital status: Married    Spouse name: Not on  file   Number of children: 4   Years of education: Not on file   Highest education level: Not on file  Occupational History   Not on file  Tobacco Use   Smoking status: Every Day    Current packs/day: 0.25    Average packs/day: 0.3 packs/day for 31.2 years (7.8 ttl pk-yrs)    Types: Cigarettes    Start date: 10/06/1992   Smokeless tobacco: Never  Vaping Use   Vaping status: Never Used  Substance and Sexual Activity   Alcohol use: Yes    Comment: occasional   Drug use: No   Sexual activity: Yes    Birth control/protection: None  Other Topics Concern   Not on file  Social History Narrative   Not on file   Social Drivers of Health   Tobacco Use: High Risk (01/06/2024)   Patient History    Smoking Tobacco Use: Every Day    Smokeless Tobacco Use: Never     Passive Exposure: Not on file  Financial Resource Strain: Not on file  Food Insecurity: Not on file  Transportation Needs: Not on file  Physical Activity: Not on file  Stress: Not on file  Social Connections: Not on file  Intimate Partner Violence: Not on file  Depression (PHQ2-9): Low Risk (01/06/2024)   Depression (PHQ2-9)    PHQ-2 Score: 2  Alcohol Screen: Not on file  Housing: Not on file  Utilities: Not on file  Health Literacy: Not on file   Tobacco Use History[2] Social History   Substance and Sexual Activity  Alcohol Use Yes   Comment: occasional    Family History:  Family History  Problem Relation Age of Onset   Diabetes Mother    Cancer Father 19       colon    Past medical history, surgical history, medications, allergies, family history and social history reviewed with patient today and changes made to appropriate areas of the chart.   Review of Systems  Constitutional: Negative.   HENT: Negative.    Eyes: Negative.   Respiratory: Negative.    Cardiovascular: Negative.   Gastrointestinal:  Positive for constipation (at times). Negative for nausea and vomiting.       Umbilical hernia  Genitourinary: Negative.   Musculoskeletal: Negative.   Skin: Negative.   Neurological: Negative.   Psychiatric/Behavioral:  The patient is nervous/anxious (stress).    All other ROS negative except what is listed above and in the HPI.      Objective:    BP 134/82 (BP Location: Left Arm, Patient Position: Sitting, Cuff Size: Normal)   Pulse 80   Temp 98 F (36.7 C)   Ht 5' 4 (1.626 m)   Wt 190 lb 6.4 oz (86.4 kg)   SpO2 97%   BMI 32.68 kg/m   Wt Readings from Last 3 Encounters:  01/06/24 190 lb 6.4 oz (86.4 kg)  10/07/23 187 lb 6.4 oz (85 kg)  07/09/23 187 lb (84.8 kg)    Physical Exam Vitals and nursing note reviewed.  Constitutional:      General: He is not in acute distress.    Appearance: Normal appearance.  HENT:     Head: Normocephalic and  atraumatic.     Right Ear: Tympanic membrane, ear canal and external ear normal.     Left Ear: Tympanic membrane, ear canal and external ear normal.     Mouth/Throat:     Mouth: Mucous membranes are moist.     Pharynx: No posterior  oropharyngeal erythema.  Eyes:     Conjunctiva/sclera: Conjunctivae normal.  Cardiovascular:     Rate and Rhythm: Normal rate and regular rhythm.     Pulses: Normal pulses.     Heart sounds: Normal heart sounds.  Pulmonary:     Effort: Pulmonary effort is normal.     Breath sounds: Normal breath sounds.  Abdominal:     Palpations: Abdomen is soft.     Tenderness: There is no abdominal tenderness.  Musculoskeletal:        General: Normal range of motion.     Cervical back: Normal range of motion and neck supple. No tenderness.     Right lower leg: No edema.     Left lower leg: No edema.  Lymphadenopathy:     Cervical: No cervical adenopathy.  Skin:    General: Skin is warm and dry.  Neurological:     General: No focal deficit present.     Mental Status: He is alert and oriented to person, place, and time.     Cranial Nerves: No cranial nerve deficit.     Coordination: Coordination normal.     Gait: Gait normal.  Psychiatric:        Mood and Affect: Mood normal.        Behavior: Behavior normal.        Thought Content: Thought content normal.        Judgment: Judgment normal.     Results for orders placed or performed during the hospital encounter of 10/23/23  POC CBG monitoring   Collection Time: 10/23/23 12:40 PM  Result Value Ref Range   POCT Glucose ([facility]) 115 (A) 70 - 99 mg/dL      Assessment & Plan:   Problem List Items Addressed This Visit       Other   Umbilical hernia without obstruction and without gangrene   He experiences intermittent pain and burning, worsened by straining. Discussed ER precautions. Schedule an appointment with a surgeon for evaluation. Can start miralax daily as needed for constipation.        Hyperlipidemia   Chronic, stable. Check CMP, CBC, lipid panel and treat based on results.       Relevant Medications   sildenafil  (VIAGRA ) 100 MG tablet   Other Relevant Orders   CBC with Differential/Platelet   Comprehensive metabolic panel with GFR   Lipid panel   Erectile dysfunction   He has had consistent difficulty achieving and maintaining an erection for three months, possibly due to stress and anxiety. Prescribe Viagra , take half to one tablet 30 minutes to one hour before intercourse. Recommend stress management through regular exercise, journaling, and discussing feelings with his spouse. Suggest therapy for stress management.       Routine general medical examination at a health care facility - Primary   Health maintenance reviewed and updated. Discussed nutrition, exercise. Follow-up 1 year.        Other Visit Diagnoses       IFG (impaired fasting glucose)       Check A1c today   Relevant Orders   Hemoglobin A1c     Screening for HIV (human immunodeficiency virus)       Screen HIV today   Relevant Orders   HIV Antibody (routine testing w rflx)     Screen for colon cancer       He declined colonoscopy - would be willing to do cologuard, order placed   Relevant Orders   Cologuard  IMMUNIZATIONS:   - Tdap: Tetanus vaccination status reviewed: last tetanus booster within 10 years. - Influenza: Declined - Pneumovax: Not applicable - Prevnar: Declined - HPV: Not applicable - Shingrix vaccine: Not applicable  SCREENING: - Colonoscopy: Ordered today  Discussed with patient purpose of the colonoscopy is to detect colon cancer at curable precancerous or early stages   - AAA Screening: Not applicable   PATIENT COUNSELING:    Sexuality: Discussed sexually transmitted diseases, partner selection, use of condoms, avoidance of unintended pregnancy  and contraceptive alternatives.   Advised to avoid cigarette smoking.  I discussed with the patient that  most people either abstain from alcohol or drink within safe limits (<=14/week and <=4 drinks/occasion for males, <=7/weeks and <= 3 drinks/occasion for females) and that the risk for alcohol disorders and other health effects rises proportionally with the number of drinks per week and how often a drinker exceeds daily limits.  Discussed cessation/primary prevention of drug use and availability of treatment for abuse.   Diet: Encouraged to adjust caloric intake to maintain  or achieve ideal body weight, to reduce intake of dietary saturated fat and total fat, to limit sodium intake by avoiding high sodium foods and not adding table salt, and to maintain adequate dietary potassium and calcium preferably from fresh fruits, vegetables, and low-fat dairy products.    stressed the importance of regular exercise  Injury prevention: Discussed safety belts, safety helmets, smoke detector, smoking near bedding or upholstery.   Dental health: Discussed importance of regular tooth brushing, flossing, and dental visits.   Follow up plan: NEXT PREVENTATIVE PHYSICAL DUE IN 1 YEAR. Return in about 3 months (around 04/05/2024) for hernia follow up.  Kandace Elrod A Hailie Searight     [1]  Allergies Allergen Reactions   Pollen Extract Other (See Comments)  [2]  Social History Tobacco Use  Smoking Status Every Day   Current packs/day: 0.25   Average packs/day: 0.3 packs/day for 31.2 years (7.8 ttl pk-yrs)   Types: Cigarettes   Start date: 10/06/1992  Smokeless Tobacco Never   "

## 2024-01-06 NOTE — Patient Instructions (Addendum)
 It was great to see you!  We are checking your labs today and will let you know the results via mychart/phone.   We will call on Monday to help with scheduling the appointment with the surgeon for your hernia  You can take miralax once a day as needed for constipation  You can call [phone] to pay your bills   Start viagra  1/2 - 1 tablet as needed when wanting to have intercourse   Let's follow-up in 3 months, sooner if you have concerns.  If a referral was placed today, you will be contacted for an appointment. Please note that routine referrals can sometimes take up to 3-4 weeks to process. Please call our office if you haven't heard anything after this time frame.  Take care,  Tinnie Harada, NP

## 2024-01-06 NOTE — Assessment & Plan Note (Signed)
Chronic, stable. Check CMP, CBC, lipid panel and treat based on results.

## 2024-01-07 LAB — HIV ANTIBODY (ROUTINE TESTING W REFLEX)
HIV 1&2 Ab, 4th Generation: NONREACTIVE
HIV FINAL INTERPRETATION: NEGATIVE

## 2024-01-09 ENCOUNTER — Ambulatory Visit: Payer: Self-pay | Admitting: Nurse Practitioner

## 2024-01-09 LAB — HEMOGLOBIN A1C: Hgb A1c MFr Bld: 5.9 % (ref 4.6–6.5)

## 2024-01-11 MED ORDER — ROSUVASTATIN CALCIUM 5 MG PO TABS: 5.0000 mg | ORAL_TABLET | Freq: Every day | ORAL | 0 refills | Status: AC

## 2024-01-22 LAB — COLOGUARD: COLOGUARD: NEGATIVE

## 2024-04-05 ENCOUNTER — Ambulatory Visit: Admitting: Nurse Practitioner
